# Patient Record
Sex: Male | Born: 1985 | Race: White | Hispanic: No | Marital: Single | Smoking: Never smoker
Health system: Southern US, Community
[De-identification: ages and names within clinical notes are randomized; demographics above are authoritative.]

## PROBLEM LIST (undated history)

## (undated) DIAGNOSIS — I10 Essential (primary) hypertension: Secondary | ICD-10-CM

## (undated) DIAGNOSIS — F32A Depression, unspecified: Secondary | ICD-10-CM

## (undated) DIAGNOSIS — F329 Major depressive disorder, single episode, unspecified: Secondary | ICD-10-CM

## (undated) DIAGNOSIS — K219 Gastro-esophageal reflux disease without esophagitis: Secondary | ICD-10-CM

## (undated) DIAGNOSIS — B019 Varicella without complication: Secondary | ICD-10-CM

## (undated) DIAGNOSIS — IMO0002 Reserved for concepts with insufficient information to code with codable children: Secondary | ICD-10-CM

## (undated) HISTORY — DX: Reserved for concepts with insufficient information to code with codable children: IMO0002

## (undated) HISTORY — DX: Varicella without complication: B01.9

## (undated) HISTORY — DX: Major depressive disorder, single episode, unspecified: F32.9

## (undated) HISTORY — DX: Essential (primary) hypertension: I10

## (undated) HISTORY — DX: Gastro-esophageal reflux disease without esophagitis: K21.9

## (undated) HISTORY — DX: Depression, unspecified: F32.A

---

## 2010-02-16 ENCOUNTER — Ambulatory Visit: Payer: Self-pay | Admitting: Sports Medicine

## 2012-08-13 ENCOUNTER — Ambulatory Visit: Payer: Self-pay | Admitting: Unknown Physician Specialty

## 2012-09-10 ENCOUNTER — Ambulatory Visit: Payer: Self-pay | Admitting: Unknown Physician Specialty

## 2012-11-21 ENCOUNTER — Ambulatory Visit (INDEPENDENT_AMBULATORY_CARE_PROVIDER_SITE_OTHER): Payer: BC Managed Care – PPO | Admitting: Internal Medicine

## 2012-11-21 ENCOUNTER — Encounter: Payer: Self-pay | Admitting: Internal Medicine

## 2012-11-21 VITALS — BP 162/78 | HR 102 | Temp 98.1°F | Resp 14 | Ht 73.0 in | Wt 180.8 lb

## 2012-11-21 DIAGNOSIS — F489 Nonpsychotic mental disorder, unspecified: Secondary | ICD-10-CM

## 2012-11-21 DIAGNOSIS — L708 Other acne: Secondary | ICD-10-CM

## 2012-11-21 DIAGNOSIS — F900 Attention-deficit hyperactivity disorder, predominantly inattentive type: Secondary | ICD-10-CM | POA: Insufficient documentation

## 2012-11-21 DIAGNOSIS — F5105 Insomnia due to other mental disorder: Secondary | ICD-10-CM

## 2012-11-21 DIAGNOSIS — F988 Other specified behavioral and emotional disorders with onset usually occurring in childhood and adolescence: Secondary | ICD-10-CM

## 2012-11-21 DIAGNOSIS — F411 Generalized anxiety disorder: Secondary | ICD-10-CM

## 2012-11-21 DIAGNOSIS — L709 Acne, unspecified: Secondary | ICD-10-CM | POA: Insufficient documentation

## 2012-11-21 DIAGNOSIS — I10 Essential (primary) hypertension: Secondary | ICD-10-CM

## 2012-11-21 MED ORDER — AMPHETAMINE-DEXTROAMPHETAMINE 20 MG PO TABS: 10.0000 mg | ORAL_TABLET | Freq: Two times a day (BID) | ORAL | Status: DC

## 2012-11-21 MED ORDER — AMLODIPINE BESYLATE 5 MG PO TABS: 5.0000 mg | ORAL_TABLET | Freq: Every day | ORAL | Status: DC

## 2012-11-21 MED ORDER — SULFAMETHOXAZOLE-TRIMETHOPRIM 800-160 MG PO TABS: 1.0000 | ORAL_TABLET | Freq: Two times a day (BID) | ORAL | Status: DC

## 2012-11-21 NOTE — Assessment & Plan Note (Signed)
Managed with ritalin. Refills given  

## 2012-11-21 NOTE — Assessment & Plan Note (Signed)
Suggested nonpharmacologic methods of management.

## 2012-11-21 NOTE — Progress Notes (Signed)
Patient ID: Jonathan Kerr, male   DOB: 06-29-1985, 27 y.o.   MRN: [id number]  Patient Active Problem List   Diagnosis Date Noted  . ADD (attention deficit hyperactivity disorder, inattentive type) 11/21/2012  . Essential hypertension, benign 11/21/2012  . Insomnia secondary to anxiety 11/21/2012  . Acne, mild 11/21/2012    Subjective:  CC:   Chief Complaint  Patient presents with  . Establish Care    HPI:   Jonathan Kerr is a 27 y.o. male who presents as a new patient to establish primary care with the chief complaint of Relocated from [place]   To be closer to Dad.  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  BS biochem.,  Masters Acupuncturist, studied in [place] .  Feeling stressed out by entreprenual endeavors and his Dad's deterioration .  gilrfried moved down here recently which has helped.  Not sleeping well,  5 to 6 hours daily ,trouble falling sleep.    Reads aregular book at night,  Only caffeine is in the morning,. (new since move to [facility])   Has not tried caffeine free.  Plays golf and walks the course.  Not exercising as much as previously   Takes adderall since elementary school. Takes it for ADD,  In the early morning.  Dr Blocker changed his dose to 10 mgs bid. Takes secodn dose, if needed at lunchtime.   GERD:  Managed with omeprazole 40 mg daily since high school .  Has symptoms within 48 hours if he lapses.  Prior EGD and colonoscopy by Mechele Collin 2 months ago for persistent gastritis.  Improved with adding daily lactobacillus, and sublingual antispasmodic prn .  Has felt better on a gluten free diet.  Gave up cheese reluctantly but feels 100% better on dairy free diet. Has tried lactase but has not helped.   Drinks almond mild or coconut milk.  Prior cholesterol screens have been very good.    Recurrent scalp bumps.   Prior history of psoriasis.  Attributes  To psoriarisi sbut exam more consistent with  Staph   Hypertension.  his whole life ,  Never treated. Resting heart rate is usually in the 60s.,  But not today.   Environmental allergies.  Takes claritin,  Tested and allergic to everything .  No history of asthma..  History of annual or biannual sinus infections but not in the last year.  Relocated from [place]   To be closer to Dad.  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  BS biochem.,  Masters Acupuncturist, studied in [place] .  Feeling stressed out by entreprenual endeavors and his Dad's deterioration .  gilrfried moved down here recently which has helped.  Not sleeping well,  5 to 6 hours daily ,trouble falling sleep.    Reads aregular book at night,  Only caffeine is in the morning,. (new since move to [facility])   Has not tried caffeine free.  Plays golf and walks the course.  Not exercising as much as previously   Takes adderall since elementary school. Takes it for ADD,  In the early morning.  Dr Blocker changed his dose to 10 mgs bid. Takes secodn dose, if needed at lunchtime.   GERD:  Managed with omeprazole 40 mg daily since high school .  Has symptoms within 48 hours if he lapses.  Prior EGD and colonoscopy by Mechele Collin  2 months ago for persistent gastritis.  Improved with adding daily lactobacillus, and sublingual antispasmodic prn .  Has felt better on a gluten free diet.  Gave up cheese reluctantly but feels 100% better on dairy free diet. Has tried lactase but has not helped.   Drinks almond mild or coconut milk.  Prior cholesterol screens have been very good.    Recurrent scalp bumps.   Prior history of psoriasis.  Attributes  To psoriarisi sbut exam more consistent with Staph   Hypertension.  his whole life ,  Never treated. Resting heart rate is usually in the 60s.,  But not today.   Environmental allergies.  Takes claritin,  Tested and allergic to everything .  No history of asthma..  History of annual or biannual  sinus infections but not in the last year.    Past Medical History  Diagnosis Date  . Depression   . Chicken pox   . GERD (gastroesophageal reflux disease)   . Ulcer   . Hypertension     History reviewed. No pertinent past surgical history.  Family History  Problem Relation Age of Onset  . Arthritis Mother   . Heart disease Mother   . Cancer Father   . Cancer Paternal Uncle     History   Social History  . Marital Status: Single    Spouse Name: N/A    Number of Children: N/A  . Years of Education: N/A   Occupational History  . Not on file.   Social History Main Topics  . Smoking status: Never Smoker   . Smokeless tobacco: Current User    Types: Snuff  . Alcohol Use: Yes  . Drug Use: No  . Sexually Active: Yes   Other Topics Concern  . Not on file   Social History Narrative  . No narrative on file       @ALLHX @    Review of Systems:   The remainder of the review of systems was negative except those addressed in the HPI.       Objective:  BP 162/78  Pulse 102  Temp(Src) 98.1 F (36.7 C) (Oral)  Resp 14  Ht 6\' 1"  (1.854 m)  Wt 180 lb 12 oz (81.988 kg)  BMI 23.85 kg/m2  SpO2 99%  General appearance: alert, cooperative and appears stated age Ears: normal TM's and external ear canals both ears Throat: lips, mucosa, and tongue normal; teeth and gums normal Neck: no adenopathy, no carotid bruit, supple, symmetrical, trachea midline and thyroid not enlarged, symmetric, no tenderness/mass/nodules Back: symmetric, no curvature. ROM normal. No CVA tenderness. Lungs: clear to auscultation bilaterally Heart: regular rate and rhythm, S1, S2 normal, no murmur, click, rub or gallop Abdomen: soft, non-tender; bowel sounds normal; no masses,  no organomegaly Pulses: 2+ and symmetric Skin: Skin color, texture, turgor normal. Erythematous pustular lesions on scalp No rashes or lesions Lymph nodes: Cervical, supraclavicular, and axillary nodes  normal.  Assessment and Plan:  ADD (attention deficit hyperactivity disorder, inattentive type) Managed with ritalin .  Refills given   Essential hypertension, benign Apparently present for years but not treated due to age  Trial of amlodipine  Insomnia secondary to anxiety Suggested nonpharmacologic methods of management.   Acne, mild With two pustular boils noted on Scalp today and facial acne noted.  7 days of empiric anti MRSA coverage with Septra.    Updated Medication List Outpatient Encounter Prescriptions as of 11/21/2012  Medication Sig Dispense Refill  . amphetamine-dextroamphetamine (ADDERALL) 20 MG  tablet Take 0.5 tablets (10 mg total) by mouth 2 (two) times daily.  60 tablet  0  . loratadine (CLARITIN) 10 MG tablet Take 10 mg by mouth daily.      Marland Kitchen omeprazole (PRILOSEC) 40 MG capsule Take 40 mg by mouth daily.      . [DISCONTINUED] amphetamine-dextroamphetamine (ADDERALL) 20 MG tablet Take 10 mg by mouth 2 (two) times daily.      . [DISCONTINUED] amphetamine-dextroamphetamine (ADDERALL) 20 MG tablet Take 0.5 tablets (10 mg total) by mouth 2 (two) times daily.  60 tablet  0  . amLODipine (NORVASC) 5 MG tablet Take 1 tablet (5 mg total) by mouth daily.  30 tablet  3  . sulfamethoxazole-trimethoprim (SEPTRA DS) 800-160 MG per tablet Take 1 tablet by mouth 2 (two) times daily.  14 tablet  0  . [DISCONTINUED] cetirizine (ZYRTEC) 10 MG tablet Take 10 mg by mouth daily.       No facility-administered encounter medications on file as of 11/21/2012.     Orders Placed This Encounter  Procedures  . Comprehensive metabolic panel  . Microalbumin / creatinine urine ratio    Return in about 4 weeks (around 12/19/2012).

## 2012-11-21 NOTE — Assessment & Plan Note (Signed)
With two pustular boils noted on Scalp today and facial acne noted.  7 days of empiric anti MRSA coverage with Septra.

## 2012-11-21 NOTE — Assessment & Plan Note (Signed)
Apparently present for years but not treated due to age  Trial of amlodipine

## 2012-11-21 NOTE — Patient Instructions (Addendum)
For your insomnia:   Try giving up caffeine  No computer work within 2 hours of bedtime  Add regular daily exercise  If no improvement,  Will initiate medication at 1 month follow up  For the scalp nodules:  Treatment for staph infection with 1 week of Septra DS  For the hypertension:  Trial of amlodipine 5 mg (1 tablet daily)  Return in 1 month

## 2012-12-04 ENCOUNTER — Encounter: Payer: Self-pay | Admitting: Unknown Physician Specialty

## 2013-01-28 ENCOUNTER — Ambulatory Visit (INDEPENDENT_AMBULATORY_CARE_PROVIDER_SITE_OTHER): Payer: BC Managed Care – PPO | Admitting: Adult Health

## 2013-01-28 ENCOUNTER — Encounter: Payer: Self-pay | Admitting: Adult Health

## 2013-01-28 VITALS — BP 126/74 | HR 68 | Temp 98.0°F | Resp 12 | Wt 181.0 lb

## 2013-01-28 DIAGNOSIS — H6120 Impacted cerumen, unspecified ear: Secondary | ICD-10-CM | POA: Insufficient documentation

## 2013-01-28 DIAGNOSIS — R21 Rash and other nonspecific skin eruption: Secondary | ICD-10-CM

## 2013-01-28 DIAGNOSIS — F900 Attention-deficit hyperactivity disorder, predominantly inattentive type: Secondary | ICD-10-CM

## 2013-01-28 DIAGNOSIS — H6121 Impacted cerumen, right ear: Secondary | ICD-10-CM

## 2013-01-28 DIAGNOSIS — Z23 Encounter for immunization: Secondary | ICD-10-CM

## 2013-01-28 DIAGNOSIS — F988 Other specified behavioral and emotional disorders with onset usually occurring in childhood and adolescence: Secondary | ICD-10-CM

## 2013-01-28 DIAGNOSIS — I10 Essential (primary) hypertension: Secondary | ICD-10-CM

## 2013-01-28 MED ORDER — AMPHETAMINE-DEXTROAMPHETAMINE 10 MG PO TABS: 10.0000 mg | ORAL_TABLET | Freq: Every day | ORAL | Status: DC

## 2013-01-28 MED ORDER — HYDROCHLOROTHIAZIDE 25 MG PO TABS: 25.0000 mg | ORAL_TABLET | Freq: Every day | ORAL | Status: DC

## 2013-01-28 NOTE — Assessment & Plan Note (Signed)
And having side effects with amlodipine of decreased libido. He has not taken this medication in approximately 21 days. Start HCTZ 25 mg one half tablet daily. Instructed to monitor blood pressure for the next one to 2 weeks. If blood pressure still above 120/70 then take one whole tablet daily.

## 2013-01-28 NOTE — Assessment & Plan Note (Signed)
Itching mainly at night. The left ankle is worse than the right. ?fungal. Appt with dermatology tomorrow.

## 2013-01-28 NOTE — Patient Instructions (Addendum)
  Please start the HCTZ 25 mg take 1/2 tablet every morning.  Please monitor your b/p for the first week or two while taking 1/2 tablet. If your b/p remains above 120/70 then take a full tablet.

## 2013-01-28 NOTE — Progress Notes (Signed)
  Subjective:    Patient ID: Jonathan Kerr, male    DOB: 1985/07/27, 27 y.o.   MRN: [id number]  HPI  Pt is pleasant 27 yo male, presents with concerns of decreased libido since beginning Norvasc 2 month ago. Pt states he stopped taking it 3 weeks ago and libido returned to normal.  Pt would like to try a different antihypertensive medication.  Pt also reports rash to back of ankles, has appt with dermatology tomorrow. He is also having trouble hearing out of his right ear and would like this checked. 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 Patient is also requesting refill on Adderall. He takes 10 mg tablets 1-2 tablets daily as needed.  Current Outpatient Prescriptions on File Prior to Visit  Medication Sig Dispense Refill  . loratadine (CLARITIN) 10 MG tablet Take 10 mg by mouth daily.      Marland Kitchen omeprazole (PRILOSEC) 40 MG capsule Take 40 mg by mouth daily.       No current facility-administered medications on file prior to visit.     Review of Systems  Constitutional: Negative.   HENT: Positive for hearing loss.   Respiratory: Negative.   Cardiovascular: Negative.   Genitourinary:       Decreased libido with amlodipine  Skin: Positive for rash.       Rash to bilateral ankles. Itching worse at night.  Psychiatric/Behavioral: Negative.   All other systems reviewed and are negative.       Objective:   Physical Exam  Constitutional: He is oriented to person, place, and time. He appears well-developed and well-nourished. No distress.  HENT:  Head: Normocephalic and atraumatic.  Right ear with cerumen impaction  Cardiovascular: Normal rate, regular rhythm, normal heart sounds and intact distal pulses.  Exam reveals no gallop and no friction rub.   No murmur heard. Pulmonary/Chest: Effort normal. No respiratory distress. He has no wheezes. He has no rales.  Lymphadenopathy:    He has no cervical adenopathy.  Neurological: He is alert and  oriented to person, place, and time.  Skin: Skin is warm, dry and intact. Rash noted.  Rash to bilateral ankles  Psychiatric: He has a normal mood and affect. His behavior is normal. Judgment and thought content normal.    BP 126/74  Pulse 68  Temp(Src) 98 F (36.7 C) (Oral)  Resp 12  Wt 181 lb (82.101 kg)  BMI 23.89 kg/m2  SpO2 96%       Assessment & Plan:

## 2013-01-28 NOTE — Addendum Note (Signed)
Addended by: Chandra Batch E on: 01/28/2013 10:32 AM   Modules accepted: Orders

## 2013-01-28 NOTE — Assessment & Plan Note (Signed)
Administered flu vaccine today.

## 2013-01-28 NOTE — Assessment & Plan Note (Signed)
Patient takes 10 mg tablets (2 tablets as needed) for ADD. Prescription was changed to 10 mg tablets. 2 prescriptions given.

## 2013-01-28 NOTE — Assessment & Plan Note (Signed)
Right ear impacted with cerumen. Tympanic membrane not visualized. Irrigate to remove the impaction.

## 2013-02-01 ENCOUNTER — Other Ambulatory Visit: Payer: Self-pay | Admitting: *Deleted

## 2013-02-01 MED ORDER — OMEPRAZOLE 40 MG PO CPDR: 40.0000 mg | DELAYED_RELEASE_CAPSULE | Freq: Every day | ORAL | Status: DC

## 2013-05-24 ENCOUNTER — Ambulatory Visit (INDEPENDENT_AMBULATORY_CARE_PROVIDER_SITE_OTHER): Payer: BC Managed Care – PPO | Admitting: Internal Medicine

## 2013-05-24 ENCOUNTER — Encounter: Payer: Self-pay | Admitting: Internal Medicine

## 2013-05-24 VITALS — BP 122/84 | HR 82 | Temp 98.2°F | Resp 18 | Ht 73.0 in | Wt 191.0 lb

## 2013-05-24 DIAGNOSIS — F411 Generalized anxiety disorder: Secondary | ICD-10-CM

## 2013-05-24 MED ORDER — AMPHETAMINE-DEXTROAMPHETAMINE 10 MG PO TABS: 10.0000 mg | ORAL_TABLET | Freq: Two times a day (BID) | ORAL | Status: DC

## 2013-05-24 MED ORDER — ALPRAZOLAM 0.5 MG PO TABS: 0.5000 mg | ORAL_TABLET | Freq: Every day | ORAL | Status: DC | PRN

## 2013-05-24 NOTE — Progress Notes (Signed)
Pre-visit discussion using our clinic review tool. No additional management support is needed unless otherwise documented below in the visit note.  

## 2013-05-24 NOTE — Patient Instructions (Signed)
Trial of short acting alprazolam to use as needed for anxiety or insomnia   Do not use if you have had more than 2 beers same day or i tmay cause excessive sedation  i

## 2013-05-24 NOTE — Progress Notes (Signed)
Patient ID: Jonathan AnconaJohn Thomas Kerr, male   DOB: 05/19/1985, 28 y.o.   MRN: [id number]  Patient Active Problem List   Diagnosis Date Noted  . Anxiety as acute reaction to exceptional stress 05/26/2013  . Cerumen impaction 01/28/2013  . Influenza vaccine needed 01/28/2013  . Rash and nonspecific skin eruption 01/28/2013  . ADD (attention deficit hyperactivity disorder, inattentive type) 11/21/2012  . Essential hypertension, benign 11/21/2012  . Insomnia secondary to anxiety 11/21/2012  . Acne, mild 11/21/2012    Subjective:  CC:   Chief Complaint  Patient presents with  . Follow-up    HPI:   Jonathan AlbeeJohn Thomas Priestis a 28 y.o. male who presents  Follow up on multiple issues  Cc;  Increased anxiety .   Triggers for increased anxiety include his father's losing battle with metastatic lung cancer and his recent business venture an opening in [employer]. He has been having episodes upon description sound like panic attacks. He is also having increased irritability and having relationship problems with his girlfriend since they are only together 3 nights per week due to a long distance relationship. He would like to start therapy. He has a history of a trial of salt Zoloft at age 28 which he took for a year. He did not likely Zoloft made him feel because it made him apathetic. He is also worried about the effect on his libido. He averages 2 beers per day and more on some occasions.   Past Medical History  Diagnosis Date  . Depression   . Chicken pox   . GERD (gastroesophageal reflux disease)   . Ulcer   . Hypertension     No past surgical history on file.     The following portions of the patient's history were reviewed and updated as appropriate: Allergies, current medications, and problem list.    Review of Systems:   12 Pt  review of systems was negative except those addressed in the HPI,     History   Social History  . Marital Status: Single    Spouse Name: N/A    Number  of Children: N/A  . Years of Education: N/A   Occupational History  . Not on file.   Social History Main Topics  . Smoking status: Never Smoker   . Smokeless tobacco: Current User    Types: Snuff  . Alcohol Use: Yes  . Drug Use: No  . Sexual Activity: Yes   Other Topics Concern  . Not on file   Social History Narrative  . No narrative on file    Objective:  Filed Vitals:   05/24/13 1336  BP: 122/84  Pulse: 82  Temp: 98.2 F (36.8 C)  Resp: 18     General appearance: alert, cooperative and appears stated age Ears: normal TM's and external ear canals both ears Throat: lips, mucosa, and tongue normal; teeth and gums normal Neck: no adenopathy, no carotid bruit, supple, symmetrical, trachea midline and thyroid not enlarged, symmetric, no tenderness/mass/nodules Back: symmetric, no curvature. ROM normal. No CVA tenderness. Lungs: clear to auscultation bilaterally Heart: regular rate and rhythm, S1, S2 normal, no murmur, click, rub or gallop Abdomen: soft, non-tender; bowel sounds normal; no masses,  no organomegaly Pulses: 2+ and symmetric Skin: Skin color, texture, turgor normal. No rashes or lesions Lymph nodes: Cervical, supraclavicular, and axillary nodes normal.  Assessment and Plan:  Anxiety as acute reaction to exceptional stress History is are entirely environmental due to his father's failing health and his business venture.  Since he has had prior adverse affects from Zoloft we discussed use of alprazolam for when necessary use for panic attacks and insomnia.. We discussed the addictive nature of this medication in fact it cannot be combined with excessive alcohol. If he finds using and a daily basis he will return and we will discuss SSRI therapy.   Updated Medication List Outpatient Encounter Prescriptions as of 05/24/2013  Medication Sig  . amphetamine-dextroamphetamine (ADDERALL) 10 MG tablet Take 1 tablet (10 mg total) by mouth 2 (two) times daily with a  meal.  . loratadine (CLARITIN) 10 MG tablet Take 10 mg by mouth daily.  Marland Kitchen omeprazole (PRILOSEC) 40 MG capsule Take 1 capsule (40 mg total) by mouth daily.  . [DISCONTINUED] amphetamine-dextroamphetamine (ADDERALL) 10 MG tablet Take 1 tablet (10 mg total) by mouth daily.  . [DISCONTINUED] amphetamine-dextroamphetamine (ADDERALL) 10 MG tablet Take 1 tablet (10 mg total) by mouth 2 (two) times daily with a meal.  . [DISCONTINUED] amphetamine-dextroamphetamine (ADDERALL) 10 MG tablet Take 1 tablet (10 mg total) by mouth 2 (two) times daily with a meal.  . ALPRAZolam (XANAX) 0.5 MG tablet Take 1 tablet (0.5 mg total) by mouth daily as needed for anxiety.  . [DISCONTINUED] hydrochlorothiazide (HYDRODIURIL) 25 MG tablet Take 1 tablet (25 mg total) by mouth daily.     No orders of the defined types were placed in this encounter.    No Follow-up on file.

## 2013-05-26 DIAGNOSIS — F339 Major depressive disorder, recurrent, unspecified: Secondary | ICD-10-CM | POA: Insufficient documentation

## 2013-05-26 NOTE — Assessment & Plan Note (Signed)
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 Since he has had prior adverse affects from Zoloft we discussed use of alprazolam for when necessary use for panic attacks and insomnia.. We discussed the addictive nature of this medication in fact it cannot be combined with excessive alcohol. If he finds using and a daily basis he will return and we will discuss SSRI therapy.

## 2013-05-29 ENCOUNTER — Telehealth: Payer: Self-pay | Admitting: Internal Medicine

## 2013-05-29 NOTE — Telephone Encounter (Signed)
Relevant patient education mailed to patient.  

## 2013-08-21 ENCOUNTER — Encounter: Payer: Self-pay | Admitting: Internal Medicine

## 2013-08-21 ENCOUNTER — Telehealth: Payer: Self-pay | Admitting: Internal Medicine

## 2013-08-21 ENCOUNTER — Ambulatory Visit (INDEPENDENT_AMBULATORY_CARE_PROVIDER_SITE_OTHER): Payer: BC Managed Care – PPO | Admitting: Internal Medicine

## 2013-08-21 VITALS — BP 110/78 | HR 96 | Temp 98.6°F | Resp 16 | Wt 183.0 lb

## 2013-08-21 DIAGNOSIS — B9789 Other viral agents as the cause of diseases classified elsewhere: Secondary | ICD-10-CM | POA: Diagnosis not present

## 2013-08-21 DIAGNOSIS — B349 Viral infection, unspecified: Secondary | ICD-10-CM

## 2013-08-21 DIAGNOSIS — R509 Fever, unspecified: Secondary | ICD-10-CM | POA: Diagnosis not present

## 2013-08-21 LAB — POC INFLUENZA A&B (BINAX/QUICKVUE)
INFLUENZA A, POC: NEGATIVE
INFLUENZA B, POC: NEGATIVE

## 2013-08-21 MED ORDER — PROMETHAZINE HCL 25 MG RE SUPP: 25.0000 mg | Freq: Four times a day (QID) | RECTAL | Status: DC | PRN

## 2013-08-21 NOTE — Progress Notes (Signed)
Pre-visit discussion using our clinic review tool. No additional management support is needed unless otherwise documented below in the visit note.  

## 2013-08-21 NOTE — Telephone Encounter (Signed)
Please advise 

## 2013-08-21 NOTE — Telephone Encounter (Signed)
Patient Information:  Caller Name: Jonita AlbeeJohn Thomas  Phone: [phone]  Patient: Marca Anconariest, Hoyle Thomas  Gender: Male  DOB: 10/02/1985  Age: 28 Years  PCP: Duncan Dullullo, Teresa (Adults only)  Office Follow Up:  Does the office need to follow up with this patient?: Yes  Instructions For The Office: Requesting Rx  RN Note:  Mom calling regarding Child/Bernard who c/o body aches, fever, chills, headache.  [phone].  Desires office visit today if this cannot be done.  Symptoms  Reason For Call & Symptoms: c/o headache sore throat, chills, fever, body aches  Reviewed Health History In EMR: Yes  Reviewed Medications In EMR: Yes  Reviewed Allergies In EMR: Yes  Reviewed Surgeries / Procedures: Yes  Date of Onset of Symptoms: 08/20/2013  Any Fever: Yes  Fever Taken: Oral  Fever Time Of Reading: 11:07:32  Fever Last Reading: 99.7  Guideline(s) Used:  Influenza - Seasonal  Disposition Per Guideline:   Discuss with PCP and Callback by Nurse Today  Reason For Disposition Reached:   Patient requests antiviral medicine for influenza and flu symptoms present < 48 hours  Advice Given:  N/A  Patient Will Follow Care Advice:  YES

## 2013-08-21 NOTE — Telephone Encounter (Signed)
Patient scheduled and notified.

## 2013-08-21 NOTE — Telephone Encounter (Signed)
Can you give him an early afternoon appt?  Get a flu and strp test ok  to overbook

## 2013-08-21 NOTE — Patient Instructions (Signed)
Your symptoms are consistent with a viral infection  Possibly Norovirus.  Your flu test was negative  If yo develop nausea with vomiting,  Start using the phenergan suppositories and simply your diet to ginger ale for hydration.  Add saltines when the vomiting has stopped   You can use ibuprofen and tylenol for achiness   Norovirus Infection Norovirus illness is caused by a viral infection. The term norovirus refers to a group of viruses. Any of those viruses can cause norovirus illness. This illness is often referred to by other names such as viral gastroenteritis, stomach flu, and food poisoning. Anyone can get a norovirus infection. People can have the illness multiple times during their lifetime. CAUSES  Norovirus is found in the stool or vomit of infected people. It is easily spread from person to person (contagious). People with norovirus are contagious from the moment they begin feeling ill. They may remain contagious for as long as 3 days to 2 weeks after recovery. People can become infected with the virus in several ways. This includes:  Eating food or drinking liquids that are contaminated with norovirus.  Touching surfaces or objects contaminated with norovirus, and then placing your hand in your mouth.  Having direct contact with a person who is infected and shows symptoms. This may occur while caring for someone with illness or while sharing foods or eating utensils with someone who is ill. SYMPTOMS  Symptoms usually begin 1 to 2 days after ingestion of the virus. Symptoms may include:  Nausea.  Vomiting.  Diarrhea.  Stomach cramps.  Low-grade fever.  Chills.  Headache.  Muscle aches.  Tiredness. Most people with norovirus illness get better within 1 to 2 days. Some people become dehydrated because they cannot drink enough liquids to replace those lost from vomiting and diarrhea. This is especially true for young children, the elderly, and others who are unable to  care for themselves. DIAGNOSIS  Diagnosis is based on your symptoms and exam. Currently, only state public health laboratories have the ability to test for norovirus in stool or vomit. TREATMENT  No specific treatment exists for norovirus infections. No vaccine is available to prevent infections. Norovirus illness is usually brief in healthy people. If you are ill with vomiting and diarrhea, you should drink enough water and fluids to keep your urine clear or pale yellow. Dehydration is the most serious health effect that can result from this infection. By drinking oral rehydration solution (ORS), people can reduce their chance of becoming dehydrated. There are many commercially available pre-made and powdered ORS designed to safely rehydrate people. These may be recommended by your caregiver. Replace any new fluid losses from diarrhea or vomiting with ORS as follows:  If your child weighs 10 kg or less (22 lb or less), give 60 to 120 ml ( to  cup or 2 to 4 oz) of ORS for each diarrheal stool or vomiting episode.  If your child weighs more than 10 kg (more than 22 lb), give 120 to 240 ml ( to 1 cup or 4 to 8 oz) of ORS for each diarrheal stool or vomiting episode. HOME CARE INSTRUCTIONS   Follow all your caregiver's instructions.  Avoid sugar-free and alcoholic drinks while ill.  Only take over-the-counter or prescription medicines for pain, vomiting, diarrhea, or fever as directed by your caregiver. You can decrease your chances of coming in contact with norovirus or spreading it by following these steps:  Frequently wash your hands, especially after using the toilet,  changing diapers, and before eating or preparing food.  Carefully wash fruits and vegetables. Cook shellfish before eating them.  Do not prepare food for others while you are infected and for at least 3 days after recovering from illness.  Thoroughly clean and disinfect contaminated surfaces immediately after an episode of  illness using a bleach-based household cleaner.  Immediately remove and wash clothing or linens that may be contaminated with the virus.  Use the toilet to dispose of any vomit or stool. Make sure the surrounding area is kept clean.  Food that may have been contaminated by an ill person should be discarded. SEEK IMMEDIATE MEDICAL CARE IF:   You develop symptoms of dehydration that do not improve with fluid replacement. This may include:  Excessive sleepiness.  Lack of tears.  Dry mouth.  Dizziness when standing.  Weak pulse. Document Released: 07/02/2002 Document Revised: 07/04/2011 Document Reviewed: 08/03/2009 [facility] Patient Information 2014 [employer] [place].

## 2013-08-21 NOTE — Progress Notes (Signed)
Patient ID: Airon Thomas Poage, male   DOB: [birth year]Marca Ancona[birth year], 28 y.o.   MRN: [id number]   Patient Active Problem List   Diagnosis Date Noted  . Acute viral syndrome 08/23/2013  . Anxiety as acute reaction to exceptional stress 05/26/2013  . Cerumen impaction 01/28/2013  . Influenza vaccine needed 01/28/2013  . Rash and nonspecific skin eruption 01/28/2013  . ADD (attention deficit hyperactivity disorder, inattentive type) 11/21/2012  . Essential hypertension, benign 11/21/2012  . Insomnia secondary to anxiety 11/21/2012  . Acne, mild 11/21/2012    Subjective:  CC:   Chief Complaint  Patient presents with  . Acute Visit    Chills, body aches , febrile 100.0    HPI:   Marca AnconaJohn Thomas Demauro is a 28 y.o. male who presents for  evaluatio of recent onset of Sore throat, followed by headache,  Myalgias and nausea.  Positive sick contacts at work,  No fevers., TMax 100.0.  Symptoms started 48 hours ago.    Past Medical History  Diagnosis Date  . Depression   . Chicken pox   . GERD (gastroesophageal reflux disease)   . Ulcer   . Hypertension     History reviewed. No pertinent past surgical history.     The following portions of the patient's history were reviewed and updated as appropriate: Allergies, current medications, and problem list.    Review of Systems:   Patient denies headache, fevers, malaise, unintentional weight loss, skin rash, eye pain, sinus congestion and sinus pain, sore throat, dysphagia,  hemoptysis , cough, dyspnea, wheezing, chest pain, palpitations, orthopnea, edema, abdominal pain, nausea, melena, diarrhea, constipation, flank pain, dysuria, hematuria, urinary  Frequency, nocturia, numbness, tingling, seizures,  Focal weakness, Loss of consciousness,  Tremor, insomnia, depression, anxiety, and suicidal ideation.     History   Social History  . Marital Status: Single    Spouse Name: N/A    Number of Children: N/A  . Years of Education: N/A    Occupational History  . Not on file.   Social History Main Topics  . Smoking status: Never Smoker   . Smokeless tobacco: Current User    Types: Snuff  . Alcohol Use: Yes  . Drug Use: No  . Sexual Activity: Yes   Other Topics Concern  . Not on file   Social History Narrative  . No narrative on file    Objective:  Filed Vitals:   08/21/13 1441  BP: 110/78  Pulse: 96  Temp: 98.6 F (37 C)  Resp: 16     General appearance: alert, cooperative and appears stated age Ears: normal TM's and external ear canals both ears Throat: lips, mucosa, and tongue normal; teeth and gums normal Neck: no adenopathy, no carotid bruit, supple, symmetrical, trachea midline and thyroid not enlarged, symmetric, no tenderness/mass/nodules Lungs: clear to auscultation bilaterally Heart: regular rate and rhythm, S1, S2 normal, no murmur, click, rub or gallop Skin: Skin color, texture, turgor normal. No rashes or lesions Lymph nodes: Cervical, supraclavicular, and axillary nodes normal.  Assessment and Plan:  Acute viral syndrome Rapid flu test was negative,  No recent contacts with flue, but norovirus a possibility .  Pr phenergan prn    Updated Medication List Outpatient Encounter Prescriptions as of 08/21/2013  Medication Sig  . ALPRAZolam (XANAX) 0.5 MG tablet Take 1 tablet (0.5 mg total) by mouth daily as needed for anxiety.  Marland Kitchen. amphetamine-dextroamphetamine (ADDERALL) 10 MG tablet Take 1 tablet (10 mg total) by mouth 2 (two) times daily with a  meal.  . loratadine (CLARITIN) 10 MG tablet Take 10 mg by mouth daily.  Marland Kitchen. omeprazole (PRILOSEC) 40 MG capsule Take 1 capsule (40 mg total) by mouth daily.  . promethazine (PHENERGAN) 25 MG suppository Place 1 suppository (25 mg total) rectally every 6 (six) hours as needed for nausea or vomiting.     Orders Placed This Encounter  Procedures  . POC Influenza A&B (Binax test)    No Follow-up on file.

## 2013-08-23 ENCOUNTER — Encounter: Payer: Self-pay | Admitting: Internal Medicine

## 2013-08-23 DIAGNOSIS — B349 Viral infection, unspecified: Secondary | ICD-10-CM | POA: Insufficient documentation

## 2013-08-23 NOTE — Assessment & Plan Note (Signed)
Rapid flu test was negative,  No recent contacts with flue, but norovirus a possibility .  Pr phenergan prn

## 2013-11-15 ENCOUNTER — Ambulatory Visit (INDEPENDENT_AMBULATORY_CARE_PROVIDER_SITE_OTHER): Payer: BC Managed Care – PPO | Admitting: Adult Health

## 2013-11-15 ENCOUNTER — Encounter: Payer: Self-pay | Admitting: Adult Health

## 2013-11-15 VITALS — BP 129/83 | HR 67 | Temp 97.5°F | Resp 14 | Ht 73.0 in | Wt 181.5 lb

## 2013-11-15 DIAGNOSIS — M25512 Pain in left shoulder: Secondary | ICD-10-CM

## 2013-11-15 DIAGNOSIS — F900 Attention-deficit hyperactivity disorder, predominantly inattentive type: Secondary | ICD-10-CM

## 2013-11-15 DIAGNOSIS — M25519 Pain in unspecified shoulder: Secondary | ICD-10-CM

## 2013-11-15 DIAGNOSIS — F909 Attention-deficit hyperactivity disorder, unspecified type: Secondary | ICD-10-CM

## 2013-11-15 MED ORDER — AMPHETAMINE-DEXTROAMPHETAMINE 10 MG PO TABS: 10.0000 mg | ORAL_TABLET | Freq: Two times a day (BID) | ORAL | Status: DC

## 2013-11-15 MED ORDER — ALPRAZOLAM 0.5 MG PO TABS: 0.5000 mg | ORAL_TABLET | Freq: Every day | ORAL | Status: DC | PRN
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
## 2013-11-15 NOTE — Progress Notes (Signed)
Pre visit review using our clinic review tool, if applicable. No additional management support is needed unless otherwise documented below in the visit note. 

## 2013-11-26 ENCOUNTER — Other Ambulatory Visit (INDEPENDENT_AMBULATORY_CARE_PROVIDER_SITE_OTHER): Payer: BC Managed Care – PPO

## 2013-11-26 ENCOUNTER — Ambulatory Visit (INDEPENDENT_AMBULATORY_CARE_PROVIDER_SITE_OTHER): Payer: BC Managed Care – PPO | Admitting: Family Medicine

## 2013-11-26 ENCOUNTER — Encounter: Payer: Self-pay | Admitting: Family Medicine

## 2013-11-26 VITALS — BP 126/82 | HR 68 | Ht 73.0 in | Wt 178.0 lb

## 2013-11-26 DIAGNOSIS — M25512 Pain in left shoulder: Secondary | ICD-10-CM

## 2013-11-26 DIAGNOSIS — M7552 Bursitis of left shoulder: Secondary | ICD-10-CM

## 2013-11-26 DIAGNOSIS — IMO0002 Reserved for concepts with insufficient information to code with codable children: Secondary | ICD-10-CM

## 2013-11-26 DIAGNOSIS — M25519 Pain in unspecified shoulder: Secondary | ICD-10-CM

## 2013-11-26 DIAGNOSIS — M75100 Unspecified rotator cuff tear or rupture of unspecified shoulder, not specified as traumatic: Secondary | ICD-10-CM

## 2013-11-26 DIAGNOSIS — M7550 Bursitis of unspecified shoulder: Secondary | ICD-10-CM | POA: Insufficient documentation

## 2013-11-26 MED ORDER — MELOXICAM 15 MG PO TABS: 15.0000 mg | ORAL_TABLET | Freq: Every day | ORAL | Status: DC
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
## 2013-11-26 NOTE — Assessment & Plan Note (Signed)
Patient was given injection today. Differential also does include early psoriatic arthritis secondary to the calcium changed on the labrum but I think this is very unlikely. Patient given home exercise program, icing protocol, and 10 days of anti-inflammatories, and we'll likely respond very well to conservative therapy. Patient will come back in 3 weeks for further evaluation and treatment.

## 2013-11-26 NOTE — Patient Instructions (Addendum)
Shoulder bursitis.  Ice 20 minutes 2 times daily. Usually after activity and before bed. Exercises 3 times a week.  Meloxicam daily for 10 days then as needed. Come back in 3 weeks and we will see how you are doing.

## 2013-12-11 ENCOUNTER — Ambulatory Visit: Payer: BC Managed Care – PPO | Admitting: Internal Medicine

## 2013-12-17 ENCOUNTER — Ambulatory Visit: Payer: BC Managed Care – PPO | Admitting: Family Medicine

## 2014-02-27 ENCOUNTER — Ambulatory Visit (INDEPENDENT_AMBULATORY_CARE_PROVIDER_SITE_OTHER): Payer: BC Managed Care – PPO | Admitting: *Deleted

## 2014-02-27 ENCOUNTER — Ambulatory Visit: Payer: BC Managed Care – PPO

## 2014-02-27 DIAGNOSIS — Z23 Encounter for immunization: Secondary | ICD-10-CM

## 2014-03-10 ENCOUNTER — Ambulatory Visit (INDEPENDENT_AMBULATORY_CARE_PROVIDER_SITE_OTHER): Payer: BC Managed Care – PPO | Admitting: Internal Medicine

## 2014-03-10 ENCOUNTER — Encounter: Payer: Self-pay | Admitting: Internal Medicine

## 2014-03-10 ENCOUNTER — Encounter (INDEPENDENT_AMBULATORY_CARE_PROVIDER_SITE_OTHER): Payer: Self-pay

## 2014-03-10 VITALS — BP 140/86 | HR 73 | Temp 98.2°F | Resp 16 | Ht 73.0 in | Wt 183.8 lb

## 2014-03-10 DIAGNOSIS — F419 Anxiety disorder, unspecified: Secondary | ICD-10-CM

## 2014-03-10 DIAGNOSIS — F5105 Insomnia due to other mental disorder: Secondary | ICD-10-CM

## 2014-03-10 DIAGNOSIS — F900 Attention-deficit hyperactivity disorder, predominantly inattentive type: Secondary | ICD-10-CM

## 2014-03-10 MED ORDER — AMPHETAMINE-DEXTROAMPHETAMINE 10 MG PO TABS: 10.0000 mg | ORAL_TABLET | Freq: Two times a day (BID) | ORAL | Status: DC

## 2014-03-10 MED ORDER — FIRST-DUKES MOUTHWASH MT SUSP: OROMUCOSAL | Status: AC

## 2014-03-10 MED ORDER — ALPRAZOLAM 0.5 MG PO TABS: 0.5000 mg | ORAL_TABLET | Freq: Every day | ORAL | Status: DC | PRN

## 2014-03-10 NOTE — Progress Notes (Signed)
Patient ID: Jonathan AnconaJohn Thomas Kerr, male   DOB: 06/03/1985, 28 y.o.   MRN: [id number]   Patient Active Problem List   Diagnosis Date Noted  . Subacromial bursitis 11/26/2013  . Left shoulder pain 11/15/2013  . Acute viral syndrome 08/23/2013  . Anxiety as acute reaction to exceptional stress 05/26/2013  . Cerumen impaction 01/28/2013  . Influenza vaccine needed 01/28/2013  . Rash and nonspecific skin eruption 01/28/2013  . ADD (attention deficit hyperactivity disorder, inattentive type) 11/21/2012  . Essential hypertension, benign 11/21/2012  . Insomnia secondary to anxiety 11/21/2012  . Acne, mild 11/21/2012    Subjective:  CC:   Chief Complaint  Patient presents with  . Follow-up    medications    HPI:   Jonathan AnconaJohn Thomas Kerr is a 28 y.o. male who presents for  Follow up on ADD and anxiety managed with adderall and alprazolam  he is doing well at work  and finishing her work on time.  he is not having any adverse effects from the medication, including headache, insomnia, tachycardia and tremor.  S  Had psoriasis ,  Saw dermatology ,  Was treatd with [place] ,  Miserable for 2 weeks:  Nause,a  Felt like death  Stopped taking it    Past Medical History  Diagnosis Date  . Depression   . Chicken pox   . GERD (gastroesophageal reflux disease)   . Ulcer   . Hypertension     No past surgical history on file.     The following portions of the patient's history were reviewed and updated as appropriate: Allergies, current medications, and problem list.    Review of Systems:   Patient denies headache, fevers, malaise, unintentional weight loss, skin rash, eye pain, sinus congestion and sinus pain, sore throat, dysphagia,  hemoptysis , cough, dyspnea, wheezing, chest pain, palpitations, orthopnea, edema, abdominal pain, nausea, melena, diarrhea, constipation, flank pain, dysuria, hematuria, urinary  Frequency, nocturia, numbness, tingling, seizures,  Focal weakness, Loss of  consciousness,  Tremor, insomnia, depression, anxiety, and suicidal ideation.     History   Social History  . Marital Status: Single    Spouse Name: N/A    Number of Children: N/A  . Years of Education: N/A   Occupational History  . Not on file.   Social History Main Topics  . Smoking status: Never Smoker   . Smokeless tobacco: Former NeurosurgeonUser    Types: Snuff  . Alcohol Use: 0.0 oz/week    0 Not specified per week  . Drug Use: No  . Sexual Activity: Yes   Other Topics Concern  . Not on file   Social History Narrative    Objective:  Filed Vitals:   03/10/14 1831  BP: 140/86  Pulse: 73  Temp: 98.2 F (36.8 C)  Resp: 16     General appearance: alert, cooperative and appears stated age Ears: normal TM's and external ear canals both ears Throat: lips, mucosa, and tongue normal; teeth and gums normal Neck: no adenopathy, no carotid bruit, supple, symmetrical, trachea midline and thyroid not enlarged, symmetric, no tenderness/mass/nodules Back: symmetric, no curvature. ROM normal. No CVA tenderness. Lungs: clear to auscultation bilaterally Heart: regular rate and rhythm, S1, S2 normal, no murmur, click, rub or gallop Abdomen: soft, non-tender; bowel sounds normal; no masses,  no organomegaly Pulses: 2+ and symmetric Skin: Skin color, texture, turgor normal. No rashes or lesions Lymph nodes: Cervical, supraclavicular, and axillary nodes normal.  Assessment and Plan:  ADD (attention deficit hyperactivity disorder, inattentive  type) Patient has been given refills for adderallfor management of ADD for 3 consecutive months and will return in 3 to 6 months.  The risks and benefits of this medication have been reviewed with patient, including drug dependence,  increased risk of suicide , syncope, arrhythmia, sudden death, insomnia and, in her case , the  benefit of weight loss  Insomnia secondary to anxiety Suggested nonpharmacologic methods of management.   Using alprazolam  rarely. The risks and benefits of benzodiazepine use were discussed with patient today including excessive sedation leading to respiratory depression,  impaired thinking/driving, and addiction.  Patient was advised to avoid concurrent use with alcohol, to use medication only as needed and not to share with others  .      Updated Medication List Outpatient Encounter Prescriptions as of 03/10/2014  Medication Sig  . ALPRAZolam (XANAX) 0.5 MG tablet Take 1 tablet (0.5 mg total) by mouth daily as needed for anxiety.  Marland Kitchen. amphetamine-dextroamphetamine (ADDERALL) 10 MG tablet Take 1 tablet (10 mg total) by mouth 2 (two) times daily with a meal.  . loratadine (CLARITIN) 10 MG tablet Take 10 mg by mouth daily.  Marland Kitchen. omeprazole (PRILOSEC) 40 MG capsule Take 1 capsule (40 mg total) by mouth daily.  . [DISCONTINUED] ALPRAZolam (XANAX) 0.5 MG tablet Take 1 tablet (0.5 mg total) by mouth daily as needed for anxiety.  . [DISCONTINUED] amphetamine-dextroamphetamine (ADDERALL) 10 MG tablet Take 1 tablet (10 mg total) by mouth 2 (two) times daily with a meal.  . [DISCONTINUED] amphetamine-dextroamphetamine (ADDERALL) 10 MG tablet Take 1 tablet (10 mg total) by mouth 2 (two) times daily with a meal.  . [DISCONTINUED] amphetamine-dextroamphetamine (ADDERALL) 10 MG tablet Take 1 tablet (10 mg total) by mouth 2 (two) times daily with a meal.  . Apremilast (OTEZLA) 30 MG TABS Take 1 tablet by mouth 2 (two) times daily.  . Diphenhyd-Hydrocort-Nystatin (FIRST-DUKES MOUTHWASH) SUSP Swish and spit   Up to 4 times daily  . meloxicam (MOBIC) 15 MG tablet Take 1 tablet (15 mg total) by mouth daily.     No orders of the defined types were placed in this encounter.    No Follow-up on file.

## 2014-03-10 NOTE — Progress Notes (Signed)
Pre-visit discussion using our clinic review tool. No additional management support is needed unless otherwise documented below in the visit note.  

## 2014-03-12 NOTE — Assessment & Plan Note (Signed)
Patient has been given refills for adderallfor management of ADD for 3 consecutive months and will return in 3 to 6 months.  The risks and benefits of this medication have been reviewed with patient, including drug dependence,  increased risk of suicide , syncope, arrhythmia, sudden death, insomnia and, in her case , the  benefit of weight loss

## 2014-03-12 NOTE — Assessment & Plan Note (Signed)
Suggested nonpharmacologic methods of management.   Using alprazolam rarely. The risks and benefits of benzodiazepine use were discussed with patient today including excessive sedation leading to respiratory depression,  impaired thinking/driving, and addiction.  Patient was advised to avoid concurrent use with alcohol, to use medication only as needed and not to share with others  .

## 2014-04-03 ENCOUNTER — Telehealth: Payer: Self-pay | Admitting: Internal Medicine

## 2014-04-03 MED ORDER — HYOSCYAMINE SULFATE 0.125 MG SL SUBL: 0.1250 mg | SUBLINGUAL_TABLET | SUBLINGUAL | Status: DC | PRN

## 2014-04-03 NOTE — Telephone Encounter (Signed)
Patient  Notified and script faxed to pharmacy.

## 2014-04-03 NOTE — Telephone Encounter (Signed)
Yes, but there are several doses, i have printed out one

## 2014-04-03 NOTE — Telephone Encounter (Signed)
Patient cannot get into see his GI doctor until 2/16 and would like to know if you can give script for Hyoscyamine for abdominal cramping stated he has been prescribed in the past. Please advise [place] in [place].

## 2014-04-09 ENCOUNTER — Other Ambulatory Visit: Payer: Self-pay

## 2014-04-09 MED ORDER — OMEPRAZOLE 40 MG PO CPDR: 40.0000 mg | DELAYED_RELEASE_CAPSULE | Freq: Every day | ORAL | Status: DC

## 2014-05-06 ENCOUNTER — Ambulatory Visit: Payer: Self-pay | Admitting: Unknown Physician Specialty

## 2014-05-29 ENCOUNTER — Ambulatory Visit: Payer: Self-pay | Admitting: Unknown Physician Specialty

## 2014-06-22 HISTORY — PX: CHOLECYSTECTOMY: SHX55

## 2014-08-11 ENCOUNTER — Encounter: Payer: Self-pay | Admitting: Internal Medicine

## 2014-08-18 LAB — SURGICAL PATHOLOGY

## 2014-09-01 ENCOUNTER — Telehealth: Payer: Self-pay | Admitting: *Deleted

## 2014-09-01 NOTE — Telephone Encounter (Signed)
Pt called requesting Alprazolam and Adderall refills.  Last OV 11.16.15.  Pt advised he would need an appoint as it has been 6 months since last OV.  Pt asked that I confirm with you that he needed an appoint.  Please advise refill and appoint status.

## 2014-09-01 NOTE — Telephone Encounter (Signed)
Yes, he does.  Controlled substances require a 6 month OV follow up.

## 2014-09-01 NOTE — Telephone Encounter (Signed)
Pt scheduled appoint 5.11.16 at 9 am.

## 2014-09-03 ENCOUNTER — Encounter: Payer: Self-pay | Admitting: Internal Medicine

## 2014-09-03 ENCOUNTER — Ambulatory Visit (INDEPENDENT_AMBULATORY_CARE_PROVIDER_SITE_OTHER): Payer: BLUE CROSS/BLUE SHIELD | Admitting: Internal Medicine

## 2014-09-03 VITALS — BP 118/70 | HR 72 | Temp 97.7°F | Resp 16 | Ht 73.0 in | Wt 172.5 lb

## 2014-09-03 DIAGNOSIS — F900 Attention-deficit hyperactivity disorder, predominantly inattentive type: Secondary | ICD-10-CM

## 2014-09-03 DIAGNOSIS — I498 Other specified cardiac arrhythmias: Secondary | ICD-10-CM

## 2014-09-03 DIAGNOSIS — F329 Major depressive disorder, single episode, unspecified: Secondary | ICD-10-CM

## 2014-09-03 DIAGNOSIS — I10 Essential (primary) hypertension: Secondary | ICD-10-CM | POA: Diagnosis not present

## 2014-09-03 DIAGNOSIS — R002 Palpitations: Secondary | ICD-10-CM | POA: Insufficient documentation

## 2014-09-03 DIAGNOSIS — F419 Anxiety disorder, unspecified: Secondary | ICD-10-CM

## 2014-09-03 DIAGNOSIS — F5105 Insomnia due to other mental disorder: Secondary | ICD-10-CM

## 2014-09-03 DIAGNOSIS — F32A Depression, unspecified: Secondary | ICD-10-CM

## 2014-09-03 LAB — COMPREHENSIVE METABOLIC PANEL
ALT: 28 U/L (ref 0–53)
AST: 30 U/L (ref 0–37)
Albumin: 4.6 g/dL (ref 3.5–5.2)
Alkaline Phosphatase: 44 U/L (ref 39–117)
BUN: 12 mg/dL (ref 6–23)
CALCIUM: 10.4 mg/dL (ref 8.4–10.5)
CO2: 30 mEq/L (ref 19–32)
Chloride: 101 mEq/L (ref 96–112)
Creatinine, Ser: 0.95 mg/dL (ref 0.40–1.50)
GFR: 99.04 mL/min (ref >60.00)
GLUCOSE: 82 mg/dL (ref 70–99)
Potassium: 4.1 mEq/L (ref 3.5–5.1)
Sodium: 137 mEq/L (ref 135–145)
TOTAL PROTEIN: 7.9 g/dL (ref 6.0–8.3)
Total Bilirubin: 0.8 mg/dL (ref 0.2–1.2)

## 2014-09-03 LAB — CBC WITH DIFFERENTIAL/PLATELET
Basophils Absolute: 0 10*3/uL (ref 0.0–0.1)
Basophils Relative: 0.4 % (ref 0.0–3.0)
EOS PCT: 3 % (ref 0.0–5.0)
Eosinophils Absolute: 0.2 10*3/uL (ref 0.0–0.7)
HCT: 44.4 % (ref 39.0–52.0)
HEMOGLOBIN: 14.6 g/dL (ref 13.0–17.0)
Lymphocytes Relative: 22.6 % (ref 12.0–46.0)
Lymphs Abs: 1.5 10*3/uL (ref 0.7–4.0)
MCHC: 32.9 g/dL (ref 30.0–36.0)
MCV: 83.4 fl (ref 78.0–100.0)
MONOS PCT: 9.9 % (ref 3.0–12.0)
Monocytes Absolute: 0.6 10*3/uL (ref 0.1–1.0)
NEUTROS ABS: 4.2 10*3/uL (ref 1.4–7.7)
NEUTROS PCT: 64.1 % (ref 43.0–77.0)
Platelets: 167 10*3/uL (ref 150.0–400.0)
RBC: 5.32 Mil/uL (ref 4.22–5.81)
RDW: 14.7 % (ref 11.5–15.5)
WBC: 6.6 10*3/uL (ref 4.0–10.5)

## 2014-09-03 LAB — LIPID PANEL
Cholesterol: 209 mg/dL — ABNORMAL HIGH (ref 0–200)
HDL: 52.6 mg/dL (ref >39.00)
LDL Cholesterol: 133 mg/dL — ABNORMAL HIGH (ref 0–99)
NONHDL: 156.4
Total CHOL/HDL Ratio: 4
Triglycerides: 115 mg/dL (ref 0.0–149.0)
VLDL: 23 mg/dL (ref 0.0–40.0)

## 2014-09-03 LAB — MAGNESIUM: Magnesium: 2.2 mg/dL (ref 1.5–2.5)

## 2014-09-03 LAB — TROPONIN I: TNIDX: 0.01 ug/L (ref 0.00–0.06)

## 2014-09-03 LAB — TSH: TSH: 0.87 u[IU]/mL (ref 0.35–4.50)

## 2014-09-03 MED ORDER — METOPROLOL TARTRATE 25 MG PO TABS: 25.0000 mg | ORAL_TABLET | Freq: Two times a day (BID) | ORAL | Status: DC

## 2014-09-03 MED ORDER — AMPHETAMINE-DEXTROAMPHETAMINE 10 MG PO TABS: 10.0000 mg | ORAL_TABLET | Freq: Two times a day (BID) | ORAL | Status: DC

## 2014-09-03 MED ORDER — VENLAFAXINE HCL 37.5 MG PO TABS
37.5000 mg | ORAL_TABLET | Freq: Two times a day (BID) | ORAL | Status: DC
Start: 2014-09-03 — End: 2014-11-26

## 2014-09-03 MED ORDER — ALPRAZOLAM 0.5 MG PO TABS: 0.5000 mg | ORAL_TABLET | Freq: Every day | ORAL | Status: DC | PRN

## 2014-09-03 NOTE — Patient Instructions (Signed)
Trial of Effexor (generic ) for your new onset depression,  Can take 2nd dose by 3 pm to avoid insomnia  Referral to Dr Maryruth BunKapur is pending.   [facility]   rx for metoprolol to  Daily until then  To prevent additional  episodes of rapid heart rate

## 2014-09-03 NOTE — Assessment & Plan Note (Addendum)
He is using alprazolam rarely. The risks and benefits of benzodiazepine use were reviewed with patient today including excessive sedation leading to respiratory depression,  impaired thinking/driving, and addiction.  Patient was advised to avoid concurrent use with alcohol, to use medication only as needed and not to share with others  .

## 2014-09-03 NOTE — Assessment & Plan Note (Signed)
New onset.  Patient has been screened for suicidality and manic behavior/symptoms.  Risk and benefits of SSRI,  SNRI's, etc discussed .  Trial of effexor 37.5 mg bid.  Return one month.

## 2014-09-03 NOTE — Assessment & Plan Note (Signed)
Patient has been given refills for adderall for management of ADD for 1  month and will return in 1 month for followup on new onset depression.

## 2014-09-03 NOTE — Progress Notes (Signed)
Pre-visit discussion using our clinic review tool. No additional management support is needed unless otherwise documented below in the visit note.  

## 2014-09-03 NOTE — Assessment & Plan Note (Addendum)
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  Given FH of VT arrest in mother,.  Cardiology referral advised.  Metoprolol tartrated 25 mg bid use one to two daiy (resting HR and BP permitting).  12 Lead today is NSR with normal QT interval Cardiac enzymes,  Mg, TSH and lytes are all pending.  Considered the risks and benefits of suspending adderall, and chose to continue it  given his chronic use of it since high school and his worsening depression

## 2014-09-03 NOTE — Progress Notes (Signed)
Patient ID: Jonathan AnconaJohn Thomas Kerr, male   DOB: 09/26/1985, 29 y.o.   MRN: [id number]  . Patient Active Problem List   Diagnosis Date Noted  . Arrhythmia 09/03/2014  . Depression 09/03/2014  . Subacromial bursitis 11/26/2013  . Left shoulder pain 11/15/2013  . Anxiety as acute reaction to exceptional stress 05/26/2013  . Influenza vaccine needed 01/28/2013  . ADD (attention deficit hyperactivity disorder, inattentive type) 11/21/2012  . Insomnia secondary to anxiety 11/21/2012  . Acne, mild 11/21/2012    Subjective:  CC:   Chief Complaint  Patient presents with  . Follow-up    medication refill    HPI:   Jonathan Kerr is a 29 y.o. male who presents for  6 month follow up on ADD and insomnia secondary to anxiety . Marland Kitchen.  Patient was last seen 6 months ago, at which time symptoms were well controlled on a stable regimen of adderall.  He contineus to function well in his work.    He had an elective cholecystectomy on Feb 28th, after workup for intermittent abdominal pain and change in bowel habits  included a normal EGD and colonoscopy.  HIDA scan with CCK was abnormal.  Since his surgery,   All GI issues  Have resolved.   2 new issues:    1) Positive depression screen. Multiple symptoms,  Decreased sociability, increased sleep,  No desire for sexual intimacy.  Girlfriend has noticed change in normally positive outlook.  Has been present for several months.  Father succumbed to metastatic lung CA and died in Feburary 2015,  patietn moved here to be closer to him.  Mother is still alive and lives here as well. History fo MDD during adolescence ,  At age 29   Brief trial of zoloft,  Which was tolerated.   2) finally, mentioned ((per request from his girlfriend, who is plastic surgery resident at [employer] [employer]) , he reports that yesterday he had an episode of tachycardia that occurred after riding his bike to the gym. About 20 minutes into an exercise program (while doing yoga) HR was  160 . Stopped  workup bc felt dizzy and presyncopal,  Remained 120 bpm,, did not ride back home due to feeling so poolry .  Did not go to ER for evaluation.  Mother has history of VT arrest and is s/p AICD.  Patient's last cardiology evaluation was in fifth grade.     Past Medical History  Diagnosis Date  . Depression   . Chicken pox   . GERD (gastroesophageal reflux disease)   . Ulcer   . Hypertension     Past Surgical History  Procedure Laterality Date  . Cholecystectomy N/A Jun 22 2014       The following portions of the patient's history were reviewed and updated as appropriate: Allergies, current medications, and problem list.    Review of Systems:   Patient denies headache, fevers, malaise, unintentional weight loss, skin rash, eye pain, sinus congestion and sinus pain, sore throat, dysphagia,  hemoptysis , cough, dyspnea, wheezing, chest pain, palpitations, orthopnea, edema, abdominal pain, nausea, melena, diarrhea, constipation, flank pain, dysuria, hematuria, urinary  Frequency, nocturia, numbness, tingling, seizures,  Focal weakness, Loss of consciousness,  Tremor, insomnia, depression, anxiety, and suicidal ideation.     History   Social History  . Marital Status: Single    Spouse Name: N/A  . Number of Children: N/A  . Years of Education: N/A   Occupational History  . Not on file.   Social  History Main Topics  . Smoking status: Never Smoker   . Smokeless tobacco: Former Neurosurgeon    Types: Snuff  . Alcohol Use: 0.0 oz/week    0 Standard drinks or equivalent per week  . Drug Use: No  . Sexual Activity: Yes   Other Topics Concern  . Not on file   Social History Narrative    Objective:  Filed Vitals:   09/03/14 0913  BP: 118/70  Pulse: 72  Temp: 97.7 F (36.5 C)  Resp: 16     General appearance: alert, cooperative and appears stated age Ears: normal TM's and external ear canals both ears Throat: lips, mucosa, and tongue normal; teeth and gums normal Neck: no  adenopathy, no carotid bruit, supple, symmetrical, trachea midline and thyroid not enlarged, symmetric, no tenderness/mass/nodules Back: symmetric, no curvature. ROM normal. No CVA tenderness. Lungs: clear to auscultation bilaterally Heart: regular rate and rhythm, S1, S2 normal, no murmur, click, rub or gallop Abdomen: soft, non-tender; bowel sounds normal; no masses,  no organomegaly Pulses: 2+ and symmetric Skin: Skin color, texture, turgor normal. No rashes or lesions Lymph nodes: Cervical, supraclavicular, and axillary nodes normal.  Assessment and Plan:  Arrhythmia SVT vs VT , self limiting.  Given FH of VT arrest in mother,.  Cardiology referral advised.  Metoprolol tartrated 25 mg bid use one to two daiy (resting HR and BP permitting).  12 Lead today is NSR with normal QT interval Cardiac enzymes,  Mg, TSH and lytes are all pending.  Considered the risks and benefits of suspending adderall, and chose to continue it  given his chronic use of it since high school and his worsening depression    Depression New onset.  Patient has been screened for suicidality and manic behavior/symptoms.  Risk and benefits of SSRI,  SNRI's, etc discussed .  Trial of effexor 37.5 mg bid.  Return one month.    ADD (attention deficit hyperactivity disorder, inattentive type) Patient has been given refills for adderall for management of ADD for 1  month and will return in 1 month for followup on new onset depression.      Insomnia secondary to anxiety He is using alprazolam rarely. The risks and benefits of benzodiazepine use were reviewed with patient today including excessive sedation leading to respiratory depression,  impaired thinking/driving, and addiction.  Patient was advised to avoid concurrent use with alcohol, to use medication only as needed and not to share with others  .        A total of 40 minutes was spent with patient more than half of which was spent in counseling patient on the  above mentioned issues , reviewing EKG,  and coordination of care.  Updated Medication List Outpatient Encounter Prescriptions as of 09/03/2014  Medication Sig  . ALPRAZolam (XANAX) 0.5 MG tablet Take 1 tablet (0.5 mg total) by mouth daily as needed for anxiety.  Marland Kitchen amphetamine-dextroamphetamine (ADDERALL) 10 MG tablet Take 1 tablet (10 mg total) by mouth 2 (two) times daily with a meal.  . loratadine (CLARITIN) 10 MG tablet Take 10 mg by mouth daily.  Marland Kitchen omeprazole (PRILOSEC) 40 MG capsule Take 1 capsule (40 mg total) by mouth daily.  . [DISCONTINUED] ALPRAZolam (XANAX) 0.5 MG tablet Take 1 tablet (0.5 mg total) by mouth daily as needed for anxiety.  . [DISCONTINUED] amphetamine-dextroamphetamine (ADDERALL) 10 MG tablet Take 1 tablet (10 mg total) by mouth 2 (two) times daily with a meal.  . Diphenhyd-Hydrocort-Nystatin (FIRST-DUKES MOUTHWASH) SUSP Swish and spit  Up to 4 times daily (Patient not taking: Reported on 09/03/2014)  . metoprolol tartrate (LOPRESSOR) 25 MG tablet Take 1 tablet (25 mg total) by mouth 2 (two) times daily.  Marland Kitchen. venlafaxine (EFFEXOR) 37.5 MG tablet Take 1 tablet (37.5 mg total) by mouth 2 (two) times daily.  . [DISCONTINUED] Apremilast (OTEZLA) 30 MG TABS Take 1 tablet by mouth 2 (two) times daily.  . [DISCONTINUED] hyoscyamine (LEVSIN SL) 0.125 MG SL tablet Place 1 tablet (0.125 mg total) under the tongue every 4 (four) hours as needed. (Patient not taking: Reported on 09/03/2014)  . [DISCONTINUED] meloxicam (MOBIC) 15 MG tablet Take 1 tablet (15 mg total) by mouth daily. (Patient not taking: Reported on 09/03/2014)   No facility-administered encounter medications on file as of 09/03/2014.     Orders Placed This Encounter  Procedures  . Magnesium  . Comprehensive metabolic panel  . TSH  . Lipid panel  . Troponin I  . CK total and CKMB (cardiac)  . CBC with Differential/Platelet  . Ambulatory referral to Cardiology  . Ambulatory referral to Psychiatry  . EKG 12-Lead     Return in about 4 weeks (around 10/01/2014).

## 2014-09-04 LAB — CK TOTAL AND CKMB (NOT AT ARMC)
CK TOTAL: 170 U/L (ref 7–232)
CK, MB: <0.7 ng/mL (ref 0.0–5.0)

## 2014-09-08 ENCOUNTER — Encounter: Payer: Self-pay | Admitting: *Deleted

## 2014-10-06 ENCOUNTER — Ambulatory Visit: Payer: BLUE CROSS/BLUE SHIELD | Admitting: Internal Medicine

## 2014-10-22 ENCOUNTER — Ambulatory Visit: Payer: BLUE CROSS/BLUE SHIELD | Admitting: Internal Medicine

## 2014-10-23 ENCOUNTER — Ambulatory Visit: Payer: BLUE CROSS/BLUE SHIELD | Admitting: Internal Medicine

## 2014-11-03 ENCOUNTER — Other Ambulatory Visit: Payer: Self-pay | Admitting: Internal Medicine

## 2014-11-03 NOTE — Telephone Encounter (Signed)
Patient last appt 09/03/14 for new onset depression.  Gave one month of medication then told to schedule appointment for follow up, please advise refill?

## 2014-11-03 NOTE — Telephone Encounter (Signed)
Pt needs rx refill Adderall. Please advise pt/msn

## 2014-11-04 ENCOUNTER — Telehealth: Payer: Self-pay

## 2014-11-04 MED ORDER — AMPHETAMINE-DEXTROAMPHETAMINE 10 MG PO TABS: 10.0000 mg | ORAL_TABLET | Freq: Two times a day (BID) | ORAL | Status: DC

## 2014-11-04 NOTE — Telephone Encounter (Signed)
Notified patient that Adderall Rx is ready to be picked up.  He verbalized understanding and said he or his mother will pick it up.

## 2014-11-04 NOTE — Telephone Encounter (Signed)
Spoke with Patient, And scheduled an appt in early august.

## 2014-11-04 NOTE — Telephone Encounter (Signed)
Refill for 30 days only.  NEEDS follow up  ON DEPRESSION prior to any more refills

## 2014-11-26 ENCOUNTER — Ambulatory Visit (INDEPENDENT_AMBULATORY_CARE_PROVIDER_SITE_OTHER): Payer: BLUE CROSS/BLUE SHIELD | Admitting: Internal Medicine

## 2014-11-26 VITALS — BP 106/72 | HR 60 | Temp 98.2°F | Ht 73.0 in | Wt 167.8 lb

## 2014-11-26 DIAGNOSIS — F329 Major depressive disorder, single episode, unspecified: Secondary | ICD-10-CM | POA: Diagnosis not present

## 2014-11-26 DIAGNOSIS — F32A Depression, unspecified: Secondary | ICD-10-CM

## 2014-11-26 DIAGNOSIS — R002 Palpitations: Secondary | ICD-10-CM

## 2014-11-26 DIAGNOSIS — F900 Attention-deficit hyperactivity disorder, predominantly inattentive type: Secondary | ICD-10-CM

## 2014-11-26 MED ORDER — AMPHETAMINE-DEXTROAMPHETAMINE 10 MG PO TABS: 10.0000 mg | ORAL_TABLET | Freq: Two times a day (BID) | ORAL | Status: DC

## 2014-11-26 NOTE — Patient Instructions (Signed)
I'm glad you are feeling better!  Let me know if Dr Maryruth Bun decides to change your Adderall or start you on any meds  Keep the metoprolol for "prn use" not daily use

## 2014-11-26 NOTE — Progress Notes (Signed)
Pre visit review using our clinic review tool, if applicable. No additional management support is needed unless otherwise documented below in the visit note. 

## 2014-11-26 NOTE — Progress Notes (Signed)
Subjective:  Patient ID: Jonathan Kerr, male    DOB: Oct 20, 1985  Age: 29 y.o. MRN: [id number]  CC: The primary encounter diagnosis was Depression. Diagnoses of ADD (attention deficit hyperactivity disorder, inattentive type) and Palpitations were also pertinent to this visit.  HPI [name] presents for follow up on several issues  1) depression diagnosed at last visit in May.  He was prescibed venlafaxine but did not start Effexor due to concerns about the potential side effects.  His symptoms have improved since his last visit.  He has an appt with Maryruth Bun today first one  2) Tachycardia.  He did not start the metoprolol because he had an epsiode of insomnia after taking 25 mg metoprolol at 7 pm one evening.  He had an evaluation by [facility]ologist at [facility] and no signs of QT prolongation were seen.   3) ADD:  His concentration has been has at baseline on his usual dose of  Adderall.  Outpatient Prescriptions Prior to Visit  Medication Sig Dispense Refill  . ALPRAZolam (XANAX) 0.5 MG tablet Take 1 tablet (0.5 mg total) by mouth daily as needed for anxiety. 30 tablet 3  . omeprazole (PRILOSEC) 40 MG capsule Take 1 capsule (40 mg total) by mouth daily. 30 capsule 5  . amphetamine-dextroamphetamine (ADDERALL) 10 MG tablet Take 1 tablet (10 mg total) by mouth 2 (two) times daily with a meal. 60 tablet 0  . Diphenhyd-Hydrocort-Nystatin (FIRST-DUKES MOUTHWASH) SUSP Swish and spit   Up to 4 times daily (Patient not taking: Reported on 09/03/2014) 237 mL 1  . loratadine (CLARITIN) 10 MG tablet Take 10 mg by mouth daily.    . metoprolol tartrate (LOPRESSOR) 25 MG tablet Take 1 tablet (25 mg total) by mouth 2 (two) times daily. (Patient not taking: Reported on 11/26/2014) 60 tablet 3  . venlafaxine (EFFEXOR) 37.5 MG tablet Take 1 tablet (37.5 mg total) by mouth 2 (two) times daily. (Patient not taking: Reported on 11/26/2014) 60 tablet 1   No facility-administered medications prior to  visit.    Review of Systems;  Patient denies headache, fevers, malaise, unintentional weight loss, skin rash, eye pain, sinus congestion and sinus pain, sore throat, dysphagia,  hemoptysis , cough, dyspnea, wheezing, chest pain, palpitations, orthopnea, edema, abdominal pain, nausea, melena, diarrhea, constipation, flank pain, dysuria, hematuria, urinary  Frequency, nocturia, numbness, tingling, seizures,  Focal weakness, Loss of consciousness,  Tremor, insomnia, depression, anxiety, and suicidal ideation.      Objective:  BP 106/72 mmHg  Pulse 60  Temp(Src) 98.2 F (36.8 C) (Oral)  Ht 6\' 1"  (1.854 m)  Wt 167 lb 12.8 oz (76.114 kg)  BMI 22.14 kg/m2  SpO2 99%  BP Readings from Last 3 Encounters:  11/26/14 106/72  09/03/14 118/70  03/10/14 140/86    Wt Readings from Last 3 Encounters:  11/26/14 167 lb 12.8 oz (76.114 kg)  09/03/14 172 lb 8 oz (78.245 kg)  03/10/14 183 lb 12 oz (83.348 kg)    General appearance: alert, cooperative and appears stated age Ears: normal TM's and external ear canals both ears Throat: lips, mucosa, and tongue normal; teeth and gums normal Neck: no adenopathy, no carotid bruit, supple, symmetrical, trachea midline and thyroid not enlarged, symmetric, no tenderness/mass/nodules Back: symmetric, no curvature. ROM normal. No CVA tenderness. Lungs: clear to auscultation bilaterally Heart: regular rate and rhythm, S1, S2 normal, no murmur, click, rub or gallop Abdomen: soft, non-tender; bowel sounds normal; no masses,  no organomegaly Pulses: 2+ and symmetric  Skin: Skin color, texture, turgor normal. No rashes or lesions Lymph nodes: Cervical, supraclavicular, and axillary nodes normal.  No results found for: HGBA1C  Lab Results  Component Value Date   CREATININE 0.95 09/03/2014    Lab Results  Component Value Date   WBC 6.6 09/03/2014   HGB 14.6 09/03/2014   HCT 44.4 09/03/2014   PLT 167.0 09/03/2014   GLUCOSE 82 09/03/2014   CHOL 209*  09/03/2014   TRIG 115.0 09/03/2014   HDL 52.60 09/03/2014   LDLCALC 133* 09/03/2014   ALT 28 09/03/2014   AST 30 09/03/2014   NA 137 09/03/2014   K 4.1 09/03/2014   CL 101 09/03/2014   CREATININE 0.95 09/03/2014   BUN 12 09/03/2014   CO2 30 09/03/2014   TSH 0.87 09/03/2014    No results found.  Assessment & Plan:   Problem List Items Addressed This Visit      Unprioritized   ADD (attention deficit hyperactivity disorder, inattentive type)    Patient has been given refills for adderall for management of ADD         Palpitations    He had an uneventufl  cardiology evaluation , and did not tolerate metoprolol.      Depression - Primary    Resolved clinically.  No indication for effexor at this time         I have discontinued Mr. Otting venlafaxine, metoprolol tartrate, amphetamine-dextroamphetamine, and amphetamine-dextroamphetamine. I have also changed his amphetamine-dextroamphetamine. Additionally, I am having him maintain his loratadine, FIRST-DUKES MOUTHWASH, omeprazole, and ALPRAZolam.  Meds ordered this encounter  Medications  . DISCONTD: amphetamine-dextroamphetamine (ADDERALL) 10 MG tablet    Sig: Take 1 tablet (10 mg total) by mouth 2 (two) times daily with a meal. May refill on or after Dec 05 2014    Dispense:  60 tablet    Refill:  0  . DISCONTD: amphetamine-dextroamphetamine (ADDERALL) 10 MG tablet    Sig: Take 1 tablet (10 mg total) by mouth 2 (two) times daily with a meal. May refill on or after Sept 12 2016    Dispense:  60 tablet    Refill:  0  . amphetamine-dextroamphetamine (ADDERALL) 10 MG tablet    Sig: Take 1 tablet (10 mg total) by mouth 2 (two) times daily with a meal. May refill on or after Feb 04 2015    Dispense:  60 tablet    Refill:  0  A total of 25 minutes of face to face time was spent with patient more than half of which was spent in counselling about the above mentioned conditions  and coordination of care   Medications  Discontinued During This Encounter  Medication Reason  . metoprolol tartrate (LOPRESSOR) 25 MG tablet   . venlafaxine (EFFEXOR) 37.5 MG tablet   . amphetamine-dextroamphetamine (ADDERALL) 10 MG tablet Reorder  . amphetamine-dextroamphetamine (ADDERALL) 10 MG tablet Reorder  . amphetamine-dextroamphetamine (ADDERALL) 10 MG tablet Reorder    Follow-up: Return in about 3 months (around 02/26/2015).   Sherlene Shams, MD

## 2014-11-27 ENCOUNTER — Ambulatory Visit: Payer: BLUE CROSS/BLUE SHIELD | Admitting: Internal Medicine

## 2014-11-28 NOTE — Assessment & Plan Note (Signed)
Resolved clinically.  No indication for effexor at this time

## 2014-11-28 NOTE — Assessment & Plan Note (Signed)
Patient has been given refills for adderall for management of ADD

## 2014-11-28 NOTE — Assessment & Plan Note (Addendum)
He had an uneventufl  cardiology evaluation , and did not tolerate metoprolol.

## 2015-01-14 ENCOUNTER — Telehealth: Payer: Self-pay | Admitting: Internal Medicine

## 2015-01-14 NOTE — Telephone Encounter (Signed)
Got a note from Dr Maryruth Bun about his heart rate being SLOW (40 to 50) .  Is he feeling tired or having any dizzy spells?

## 2015-01-15 NOTE — Telephone Encounter (Signed)
Patient stated he feels fine no issues with dizziness or fatigue. FYI

## 2015-04-01 ENCOUNTER — Telehealth: Payer: Self-pay | Admitting: Internal Medicine

## 2015-04-01 ENCOUNTER — Ambulatory Visit: Payer: BLUE CROSS/BLUE SHIELD | Admitting: Internal Medicine

## 2015-04-01 NOTE — Telephone Encounter (Signed)
FYI, Pt called stating that he cannot get off of work for his appt today. Pt did make another appt for 04/29/2015. Appt is still on the sch. Thank You!

## 2015-04-29 ENCOUNTER — Ambulatory Visit (INDEPENDENT_AMBULATORY_CARE_PROVIDER_SITE_OTHER): Payer: BLUE CROSS/BLUE SHIELD | Admitting: Internal Medicine

## 2015-04-29 ENCOUNTER — Encounter: Payer: Self-pay | Admitting: Internal Medicine

## 2015-04-29 ENCOUNTER — Encounter (INDEPENDENT_AMBULATORY_CARE_PROVIDER_SITE_OTHER): Payer: Self-pay

## 2015-04-29 VITALS — BP 138/84 | HR 53 | Temp 98.1°F | Resp 12 | Ht 73.0 in | Wt 186.1 lb

## 2015-04-29 DIAGNOSIS — F419 Anxiety disorder, unspecified: Secondary | ICD-10-CM

## 2015-04-29 DIAGNOSIS — F900 Attention-deficit hyperactivity disorder, predominantly inattentive type: Secondary | ICD-10-CM

## 2015-04-29 DIAGNOSIS — K219 Gastro-esophageal reflux disease without esophagitis: Secondary | ICD-10-CM

## 2015-04-29 DIAGNOSIS — F5105 Insomnia due to other mental disorder: Secondary | ICD-10-CM

## 2015-04-29 MED ORDER — AMPHETAMINE-DEXTROAMPHETAMINE 10 MG PO TABS: 10.0000 mg | ORAL_TABLET | Freq: Two times a day (BID) | ORAL | Status: DC

## 2015-04-29 MED ORDER — OMEPRAZOLE 40 MG PO CPDR: 40.0000 mg | DELAYED_RELEASE_CAPSULE | Freq: Every day | ORAL | Status: DC

## 2015-04-29 NOTE — Patient Instructions (Signed)
Your BP is a little up today compared to previous measurements.  Please check your BP a few times over the next month and send me the readings.   Once you finish studying,  Try to turn off all "blue lit" devices 2 hours prior to bedtime to mitigate their effect on your sleep

## 2015-04-29 NOTE — Progress Notes (Signed)
Subjective:  Patient ID: Marca AnconaJohn Thomas Getman, male    DOB: 04/29/1985  Age: 30 y.o. MRN: [id number]  CC: The primary encounter diagnosis was Insomnia secondary to anxiety. Diagnoses of ADD (attention deficit hyperactivity disorder, inattentive type) and Gastroesophageal reflux disease without esophagitis were also pertinent to this visit.  HPI Marca AnconaJohn Thomas Rooke presents for follow up on ADD, anxiety, and  GERD. He was  last seen by Dr. Maryruth BunKapur in October and his dose of Zoloft was increased to 100 mg daily . He continues to use Xanax as needed for insomnia, and Dr Maryruth BunKapur has been prescribing it for him. .  He has gained 19 lbs since August, his last visit.  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  The work is not difficult,  But requires many hours of online work.    .  Outpatient Prescriptions Prior to Visit  Medication Sig Dispense Refill  . ALPRAZolam (XANAX) 0.5 MG tablet Take 1 tablet (0.5 mg total) by mouth daily as needed for anxiety. 30 tablet 3  . Diphenhyd-Hydrocort-Nystatin (FIRST-DUKES MOUTHWASH) SUSP Swish and spit   Up to 4 times daily 237 mL 1  . loratadine (CLARITIN) 10 MG tablet Take 10 mg by mouth daily.    Marland Kitchen. amphetamine-dextroamphetamine (ADDERALL) 10 MG tablet Take 1 tablet (10 mg total) by mouth 2 (two) times daily with a meal. May refill on or after Feb 04 2015 60 tablet 0  . omeprazole (PRILOSEC) 40 MG capsule Take 1 capsule (40 mg total) by mouth daily. 30 capsule 5   No facility-administered medications prior to visit.    Review of Systems;  Patient denies headache, fevers, malaise, unintentional weight loss, skin rash, eye pain, sinus congestion and sinus pain, sore throat, dysphagia,  hemoptysis , cough, dyspnea, wheezing, chest pain, palpitations, orthopnea, edema, abdominal pain, nausea, melena, diarrhea,  constipation, flank pain, dysuria, hematuria, urinary  Frequency, nocturia, numbness, tingling, seizures,  Focal weakness, Loss of consciousness,  Tremor, insomnia, depression, anxiety, and suicidal ideation.      Objective:  BP 138/84 mmHg  Pulse 53  Temp(Src) 98.1 F (36.7 C) (Oral)  Resp 12  Ht 6\' 1"  (1.854 m)  Wt 186 lb 2 oz (84.426 kg)  BMI 24.56 kg/m2  SpO2 99%  BP Readings from Last 3 Encounters:  04/29/15 138/84  11/26/14 106/72  09/03/14 118/70    Wt Readings from Last 3 Encounters:  04/29/15 186 lb 2 oz (84.426 kg)  11/26/14 167 lb 12.8 oz (76.114 kg)  09/03/14 172 lb 8 oz (78.245 kg)    General appearance: alert, cooperative and appears stated age Ears: normal TM's and external ear canals both ears Throat: lips, mucosa, and tongue normal; teeth and gums normal Neck: no adenopathy, no carotid bruit, supple, symmetrical, trachea midline and thyroid not enlarged, symmetric, no tenderness/mass/nodules Back: symmetric, no curvature. ROM normal. No CVA tenderness. Lungs: clear to auscultation bilaterally Heart: regular rate and rhythm, S1, S2 normal, no murmur, click, rub or gallop Abdomen: soft, non-tender; bowel sounds normal; no masses,  no organomegaly Pulses: 2+ and symmetric Skin: Skin color, texture, turgor normal. No rashes or lesions Lymph nodes: Cervical, supraclavicular, and axillary nodes normal.  No results found for: HGBA1C  Lab Results  Component Value Date   CREATININE 0.95 09/03/2014    Lab Results  Component Value  Date   WBC 6.6 09/03/2014   HGB 14.6 09/03/2014   HCT 44.4 09/03/2014   PLT 167.0 09/03/2014   GLUCOSE 82 09/03/2014   CHOL 209* 09/03/2014   TRIG 115.0 09/03/2014   HDL 52.60 09/03/2014   LDLCALC 133* 09/03/2014   ALT 28 09/03/2014   AST 30 09/03/2014   NA 137 09/03/2014   K 4.1 09/03/2014   CL 101 09/03/2014   CREATININE 0.95 09/03/2014   BUN 12 09/03/2014   CO2 30 09/03/2014   TSH 0.87 09/03/2014    No results  found.  Assessment & Plan:   Problem List Items Addressed This Visit    ADD (attention deficit hyperactivity disorder, inattentive type)    Patient has been taking the medication as prescribed and functioining well both at work and at home.  Denies side effects including tachycardia, nervousness and insomnia. Patient has been given refills for adderall for management of ADD           Insomnia secondary to anxiety - Primary    Aggravated by late night online work, . Reviewed principles of good sleep hygiene. Continue prn alprazolam for now.       Relevant Medications   sertraline (ZOLOFT) 100 MG tablet   GERD (gastroesophageal reflux disease)    Discussed current controversy regarding prolonged use of PPI in patients without documented Barretts esophagus.  Patient has no prior EGD but has been on PPI therapy for > 5 years (per patient).  Suggested trial of pepcid 20 mg daily.  If GERD symptoms return,  advised her to accept referral for EGD.       Relevant Medications   omeprazole (PRILOSEC) 40 MG capsule      I am having Mr. Golay maintain his loratadine, FIRST-DUKES MOUTHWASH, ALPRAZolam, sertraline, omeprazole, and amphetamine-dextroamphetamine.  Meds ordered this encounter  Medications  . sertraline (ZOLOFT) 100 MG tablet    Sig: Take 100 mg by mouth daily.    Refill:  3  . DISCONTD: amphetamine-dextroamphetamine (ADDERALL) 10 MG tablet    Sig: Take 1 tablet (10 mg total) by mouth 2 (two) times daily with a meal.    Dispense:  60 tablet    Refill:  0  . omeprazole (PRILOSEC) 40 MG capsule    Sig: Take 1 capsule (40 mg total) by mouth daily.    Dispense:  30 capsule    Refill:  5  . DISCONTD: amphetamine-dextroamphetamine (ADDERALL) 10 MG tablet    Sig: Take 1 tablet (10 mg total) by mouth 2 (two) times daily with a meal.    Dispense:  60 tablet    Refill:  0    May refill on or after May 27 2015  . amphetamine-dextroamphetamine (ADDERALL) 10 MG tablet    Sig: Take  1 tablet (10 mg total) by mouth 2 (two) times daily with a meal.    Dispense:  60 tablet    Refill:  0    May refill on or after June 24 2015    Medications Discontinued During This Encounter  Medication Reason  . amphetamine-dextroamphetamine (ADDERALL) 10 MG tablet Reorder  . omeprazole (PRILOSEC) 40 MG capsule Reorder  . amphetamine-dextroamphetamine (ADDERALL) 10 MG tablet Reorder  . amphetamine-dextroamphetamine (ADDERALL) 10 MG tablet Reorder    Follow-up: No Follow-up on file.   Sherlene Shams, MD

## 2015-04-29 NOTE — Progress Notes (Signed)
Pre-visit discussion using our clinic review tool. No additional management support is needed unless otherwise documented below in the visit note.  

## 2015-05-02 DIAGNOSIS — K219 Gastro-esophageal reflux disease without esophagitis: Secondary | ICD-10-CM | POA: Insufficient documentation

## 2015-05-02 NOTE — Assessment & Plan Note (Signed)
Patient has been taking the medication as prescribed and functioining well both at work and at home.  Denies side effects including tachycardia, nervousness and insomnia. Patient has been given refills for adderall for management of ADD

## 2015-05-02 NOTE — Assessment & Plan Note (Signed)
Discussed current controversy regarding prolonged use of PPI in patients without documented Barretts esophagus.  Patient has no prior EGD but has been on PPI therapy for > 5 years (per patient).  Suggested trial of pepcid 20 mg daily.  If GERD symptoms return,  advised her to accept referral for EGD.  

## 2015-05-02 NOTE — Assessment & Plan Note (Signed)
Aggravated by late night online work, . Reviewed principles of good sleep hygiene. Continue prn alprazolam for now.

## 2015-09-19 IMAGING — NM NUCLEAR MEDICINE HEPATOHBILIARY INCLUDE GB
2 series · 16 of 16 positions shown · non-contrast
Comparison: Abdominal ultrasound 05/06/2014

CLINICAL DATA: Postprandial abdominal pain with nausea and
vomiting. Symptoms for 2 years. Initial encounter.

EXAM:
NUCLEAR MEDICINE HEPATOBILIARY IMAGING WITH GALLBLADDER EF
TECHNIQUE: Sequential images of the abdomen were obtained [DATE] minutes
following intravenous administration of radiopharmaceutical. After
slow intravenous infusion of 1.61 micrograms Cholecystokinin,
gallbladder ejection fraction was determined.
RADIOPHARMACEUTICALS:  8.29 Millicurie 9c-PPm Choletec

[Series 1000: gallbladder ef · 4.80mm/px · 6 of 120 frames shown]
[frame 11/120]
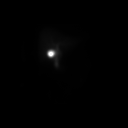
[frame 31/120]
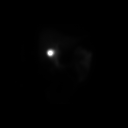
[frame 51/120]
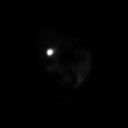
[frame 71/120]
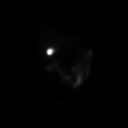
[frame 91/120]
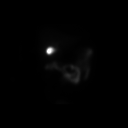
[frame 111/120]
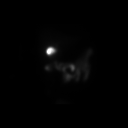

[Series 1000: gallbladder statics · 4.80mm/px · 10 of 10 slices shown]
[im 1/10]
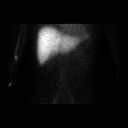
[im 2/10]
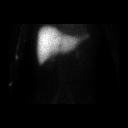
[im 3/10]
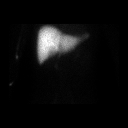
[im 4/10]
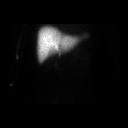
[im 5/10]
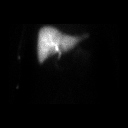
[im 6/10]
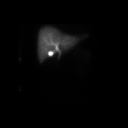
[im 7/10]
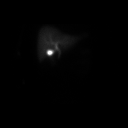
[im 8/10]
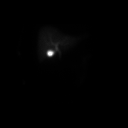
[im 9/10]
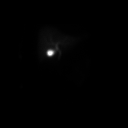
[im 10/10]
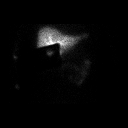

[16 of 16 positions shown; findings below may reference images not displayed]

FINDINGS: Initial images demonstrate homogeneous hepatic activity with
spontaneous filling of the biliary system and gallbladder.

Post CCK, there is adequate gallbladder contraction and increased
small bowel activity. The gallbladder ejection fraction calculated
at 30 minutes is 30%. Normal gallbladder ejection fraction with CCK
is 30% or greater.

The patient did experience symptoms (mild abdominal discomfort and
cramping) during the stimulated portion of the study.
IMPRESSION: 1. The cystic and common bile ducts are patent.
2. The gallbladder ejection fraction is at the lower limits of
normal (30%). Patient did experience mild abdominal discomfort and
cramping during CCK administration.

## 2015-11-04 ENCOUNTER — Ambulatory Visit: Payer: BLUE CROSS/BLUE SHIELD | Admitting: Internal Medicine

## 2015-12-03 ENCOUNTER — Ambulatory Visit: Payer: BLUE CROSS/BLUE SHIELD | Admitting: Internal Medicine

## 2016-01-09 ENCOUNTER — Other Ambulatory Visit: Payer: Self-pay | Admitting: Internal Medicine

## 2016-01-14 ENCOUNTER — Ambulatory Visit: Payer: BLUE CROSS/BLUE SHIELD | Admitting: Internal Medicine

## 2016-05-11 ENCOUNTER — Other Ambulatory Visit: Payer: Self-pay | Admitting: Internal Medicine

## 2016-05-16 ENCOUNTER — Ambulatory Visit: Payer: BLUE CROSS/BLUE SHIELD | Admitting: Internal Medicine

## 2016-05-24 ENCOUNTER — Encounter: Payer: Self-pay | Admitting: Internal Medicine

## 2016-05-24 ENCOUNTER — Ambulatory Visit (INDEPENDENT_AMBULATORY_CARE_PROVIDER_SITE_OTHER): Payer: PRIVATE HEALTH INSURANCE | Admitting: Internal Medicine

## 2016-05-24 VITALS — BP 150/88 | HR 77 | Resp 16 | Ht 73.0 in | Wt 193.0 lb

## 2016-05-24 DIAGNOSIS — R030 Elevated blood-pressure reading, without diagnosis of hypertension: Secondary | ICD-10-CM

## 2016-05-24 DIAGNOSIS — Z23 Encounter for immunization: Secondary | ICD-10-CM

## 2016-05-24 DIAGNOSIS — F900 Attention-deficit hyperactivity disorder, predominantly inattentive type: Secondary | ICD-10-CM | POA: Diagnosis not present

## 2016-05-24 DIAGNOSIS — Z0000 Encounter for general adult medical examination without abnormal findings: Secondary | ICD-10-CM | POA: Diagnosis not present

## 2016-05-24 DIAGNOSIS — R5383 Other fatigue: Secondary | ICD-10-CM

## 2016-05-24 DIAGNOSIS — F3341 Major depressive disorder, recurrent, in partial remission: Secondary | ICD-10-CM

## 2016-05-24 DIAGNOSIS — E559 Vitamin D deficiency, unspecified: Secondary | ICD-10-CM

## 2016-05-24 DIAGNOSIS — Z79899 Other long term (current) drug therapy: Secondary | ICD-10-CM | POA: Diagnosis not present

## 2016-05-24 DIAGNOSIS — R002 Palpitations: Secondary | ICD-10-CM | POA: Diagnosis not present

## 2016-05-24 DIAGNOSIS — E7801 Familial hypercholesterolemia: Secondary | ICD-10-CM

## 2016-05-24 DIAGNOSIS — R748 Abnormal levels of other serum enzymes: Secondary | ICD-10-CM | POA: Diagnosis not present

## 2016-05-24 LAB — CBC WITH DIFFERENTIAL/PLATELET
BASOS PCT: 0.6 % (ref 0.0–3.0)
Basophils Absolute: 0 10*3/uL (ref 0.0–0.1)
EOS PCT: 3.6 % (ref 0.0–5.0)
Eosinophils Absolute: 0.2 10*3/uL (ref 0.0–0.7)
HCT: 44.7 % (ref 39.0–52.0)
HEMOGLOBIN: 14.8 g/dL (ref 13.0–17.0)
Lymphocytes Relative: 25 % (ref 12.0–46.0)
Lymphs Abs: 1.5 10*3/uL (ref 0.7–4.0)
MCHC: 33 g/dL (ref 30.0–36.0)
MCV: 87.1 fl (ref 78.0–100.0)
MONOS PCT: 9.5 % (ref 3.0–12.0)
Monocytes Absolute: 0.6 10*3/uL (ref 0.1–1.0)
Neutro Abs: 3.7 10*3/uL (ref 1.4–7.7)
Neutrophils Relative %: 61.3 % (ref 43.0–77.0)
Platelets: 191 10*3/uL (ref 150.0–400.0)
RBC: 5.13 Mil/uL (ref 4.22–5.81)
RDW: 13.7 % (ref 11.5–15.5)
WBC: 6 10*3/uL (ref 4.0–10.5)

## 2016-05-24 LAB — COMPREHENSIVE METABOLIC PANEL
ALT: 41 U/L (ref 0–53)
AST: 40 U/L — AB (ref 0–37)
Albumin: 5 g/dL (ref 3.5–5.2)
Alkaline Phosphatase: 49 U/L (ref 39–117)
BUN: 18 mg/dL (ref 6–23)
CALCIUM: 10.4 mg/dL (ref 8.4–10.5)
CHLORIDE: 101 meq/L (ref 96–112)
CO2: 30 meq/L (ref 19–32)
Creatinine, Ser: 1.13 mg/dL (ref 0.40–1.50)
GFR: 80.15 mL/min (ref >60.00)
Glucose, Bld: 101 mg/dL — ABNORMAL HIGH (ref 70–99)
POTASSIUM: 4.6 meq/L (ref 3.5–5.1)
Sodium: 138 mEq/L (ref 135–145)
Total Bilirubin: 0.7 mg/dL (ref 0.2–1.2)
Total Protein: 8.1 g/dL (ref 6.0–8.3)

## 2016-05-24 LAB — LIPID PANEL
CHOL/HDL RATIO: 4
Cholesterol: 255 mg/dL — ABNORMAL HIGH (ref 0–200)
HDL: 60.8 mg/dL (ref >39.00)
LDL Cholesterol: 170 mg/dL — ABNORMAL HIGH (ref 0–99)
NONHDL: 194.43
Triglycerides: 122 mg/dL (ref 0.0–149.0)
VLDL: 24.4 mg/dL (ref 0.0–40.0)

## 2016-05-24 LAB — VITAMIN D 25 HYDROXY (VIT D DEFICIENCY, FRACTURES): VITD: 25.77 ng/mL — AB (ref 30.00–100.00)

## 2016-05-24 LAB — TSH: TSH: 1.41 u[IU]/mL (ref 0.35–4.50)

## 2016-05-24 MED ORDER — PROPRANOLOL HCL 10 MG PO TABS: 10.0000 mg | ORAL_TABLET | Freq: Three times a day (TID) | ORAL | 0 refills | Status: AC

## 2016-05-24 MED ORDER — SERTRALINE HCL 100 MG PO TABS: 100.0000 mg | ORAL_TABLET | Freq: Every day | ORAL | 0 refills | Status: DC

## 2016-05-24 MED ORDER — AMPHETAMINE-DEXTROAMPHETAMINE 10 MG PO TABS
10.0000 mg | ORAL_TABLET | Freq: Two times a day (BID) | ORAL | 0 refills | Status: DC
Start: 2016-05-24 — End: 2016-05-24

## 2016-05-24 MED ORDER — AMPHETAMINE-DEXTROAMPHETAMINE 10 MG PO TABS: 10.0000 mg | ORAL_TABLET | Freq: Two times a day (BID) | ORAL | 0 refills | Status: DC

## 2016-05-24 MED ORDER — ALPRAZOLAM 0.5 MG PO TABS: 0.5000 mg | ORAL_TABLET | Freq: Every day | ORAL | 3 refills | Status: DC | PRN

## 2016-05-24 MED ORDER — OMEPRAZOLE 40 MG PO CPDR: 40.0000 mg | DELAYED_RELEASE_CAPSULE | Freq: Every day | ORAL | 1 refills | Status: AC

## 2016-05-24 NOTE — Patient Instructions (Signed)
Good to see you!  Labs should be resulted in 48 hours  Let me know when you are ready to wean from zoloft  Get BP checked,  Resume metoprolol for BP consistently > 120/70  Health Maintenance, Male A healthy lifestyle and preventative care can promote health and wellness.  Maintain regular health, dental, and eye exams.  Eat a healthy diet. Foods like vegetables, fruits, whole grains, low-fat dairy products, and lean protein foods contain the nutrients you need and are low in calories. Decrease your intake of foods high in solid fats, added sugars, and salt. Get information about a proper diet from your health care provider, if necessary.  Regular physical exercise is one of the most important things you can do for your health. Most adults should get at least 150 minutes of moderate-intensity exercise (any activity that increases your heart rate and causes you to sweat) each week. In addition, most adults need muscle-strengthening exercises on 2 or more days a week.   Maintain a healthy weight. The body mass index (BMI) is a screening tool to identify possible weight problems. It provides an estimate of body fat based on height and weight. Your health care provider can find your BMI and can help you achieve or maintain a healthy weight. For males 20 years and older:  A BMI below 18.5 is considered underweight.  A BMI of 18.5 to 24.9 is normal.  A BMI of 25 to 29.9 is considered overweight.  A BMI of 30 and above is considered obese.  Maintain normal blood lipids and cholesterol by exercising and minimizing your intake of saturated fat. Eat a balanced diet with plenty of fruits and vegetables. Blood tests for lipids and cholesterol should begin at age 32 and be repeated every 5 years. If your lipid or cholesterol levels are high, you are over age 32, or you are at high risk for heart disease, you may need your cholesterol levels checked more frequently.Ongoing high lipid and cholesterol  levels should be treated with medicines if diet and exercise are not working.  If you smoke, find out from your health care provider how to quit. If you do not use tobacco, do not start.  Lung cancer screening is recommended for adults aged 55-80 years who are at high risk for developing lung cancer because of a history of smoking. A yearly low-dose CT scan of the lungs is recommended for people who have at least a 30-pack-year history of smoking and are current smokers or have quit within the past 15 years. A pack year of smoking is smoking an average of 1 pack of cigarettes a day for 1 year (for example, a 30-pack-year history of smoking could mean smoking 1 pack a day for 30 years or 2 packs a day for 15 years). Yearly screening should continue until the smoker has stopped smoking for at least 15 years. Yearly screening should be stopped for people who develop a health problem that would prevent them from having lung cancer treatment.  If you choose to drink alcohol, do not have more than 2 drinks per day. One drink is considered to be 12 oz (360 mL) of beer, 5 oz (150 mL) of wine, or 1.5 oz (45 mL) of liquor.  Avoid the use of street drugs. Do not share needles with anyone. Ask for help if you need support or instructions about stopping the use of drugs.  High blood pressure causes heart disease and increases the risk of stroke. High blood  pressure is more likely to develop in:  People who have blood pressure in the end of the normal range (100-139/85-89 mm Hg).  People who are overweight or obese.  People who are African American.  If you are 59-75 years of age, have your blood pressure checked every 3-5 years. If you are 43 years of age or older, have your blood pressure checked every year. You should have your blood pressure measured twice-once when you are at a hospital or clinic, and once when you are not at a hospital or clinic. Record the average of the two measurements. To check your  blood pressure when you are not at a hospital or clinic, you can use:  An automated blood pressure machine at a pharmacy.  A home blood pressure monitor.  If you are 72-63 years old, ask your health care provider if you should take aspirin to prevent heart disease.  Diabetes screening involves taking a blood sample to check your fasting blood sugar level. This should be done once every 3 years after age 7 if you are at a normal weight and without risk factors for diabetes. Testing should be considered at a younger age or be carried out more frequently if you are overweight and have at least 1 risk factor for diabetes.  Colorectal cancer can be detected and often prevented. Most routine colorectal cancer screening begins at the age of 63 and continues through age 67. However, your health care provider may recommend screening at an earlier age if you have risk factors for colon cancer. On a yearly basis, your health care provider may provide home test kits to check for hidden blood in the stool. A small camera at the end of a tube may be used to directly examine the colon (sigmoidoscopy or colonoscopy) to detect the earliest forms of colorectal cancer. Talk to your health care provider about this at age 60 when routine screening begins. A direct exam of the colon should be repeated every 5-10 years through age 65, unless early forms of precancerous polyps or small growths are found.  People who are at an increased risk for hepatitis B should be screened for this virus. You are considered at high risk for hepatitis B if:  You were born in a country where hepatitis B occurs often. Talk with your health care provider about which countries are considered high risk.  Your parents were born in a high-risk country and you have not received a shot to protect against hepatitis B (hepatitis B vaccine).  You have HIV or AIDS.  You use needles to inject street drugs.  You live with, or have sex with,  someone who has hepatitis B.  You are a man who has sex with other men (MSM).  You get hemodialysis treatment.  You take certain medicines for conditions like cancer, organ transplantation, and autoimmune conditions.  Hepatitis C blood testing is recommended for all people born from [birth year] through 1966 and any individual with known risk factors for hepatitis C.  Healthy men should no longer receive prostate-specific antigen (PSA) blood tests as part of routine cancer screening. Talk to your health care provider about prostate cancer screening.  Testicular cancer screening is not recommended for adolescents or adult males who have no symptoms. Screening includes self-exam, a health care provider exam, and other screening tests. Consult with your health care provider about any symptoms you have or any concerns you have about testicular cancer.  Practice safe sex. Use condoms and avoid  high-risk sexual practices to reduce the spread of sexually transmitted infections (STIs).  You should be screened for STIs, including gonorrhea and chlamydia if:  You are sexually active and are younger than 24 years.  You are older than 24 years, and your health care provider tells you that you are at risk for this type of infection.  Your sexual activity has changed since you were last screened, and you are at an increased risk for chlamydia or gonorrhea. Ask your health care provider if you are at risk.  If you are at risk of being infected with HIV, it is recommended that you take a prescription medicine daily to prevent HIV infection. This is called pre-exposure prophylaxis (PrEP). You are considered at risk if:  You are a man who has sex with other men (MSM).  You are a heterosexual man who is sexually active with multiple partners.  You take drugs by injection.  You are sexually active with a partner who has HIV.  Talk with your health care provider about whether you are at high risk of being  infected with HIV. If you choose to begin PrEP, you should first be tested for HIV. You should then be tested every 3 months for as long as you are taking PrEP.  Use sunscreen. Apply sunscreen liberally and repeatedly throughout the day. You should seek shade when your shadow is shorter than you. Protect yourself by wearing long sleeves, pants, a wide-brimmed hat, and sunglasses year round whenever you are outdoors.  Tell your health care provider of new moles or changes in moles, especially if there is a change in shape or color. Also, tell your health care provider if a mole is larger than the size of a pencil eraser.  A one-time screening for abdominal aortic aneurysm (AAA) and surgical repair of large AAAs by ultrasound is recommended for men aged 65-75 years who are current or former smokers.  Stay current with your vaccines (immunizations). This information is not intended to replace advice given to you by your health care provider. Make sure you discuss any questions you have with your health care provider. Document Released: 10/08/2007 Document Revised: 05/02/2014 Document Reviewed: 01/13/2015 Elsevier Interactive Patient Education  2017 [employer].

## 2016-05-24 NOTE — Progress Notes (Signed)
Patient ID: Jonathan Kerr, male    DOB: 08/07/1985  Age: 31 y.o. MRN: [id number]  The patient is here for annualexamination and management of other chronic and acute problems.   The risk factors are reflected in the social history.  The roster of all physicians providing medical care to patient - is listed in the Snapshot section of the chart.  Home safety : The patient has smoke detectors in the home. They wear seatbelts.  There are no firearms at home. There is no violence in the home.   There is no risks for hepatitis, STDs or HIV. There is no   history of blood transfusion. They have no travel history to infectious disease endemic areas of the world.  The patient has seen their dentist in the last six month. They have seen their eye doctor in the last year.   They do not  have excessive sun exposure. Discussed the need for sun protection: hats, long sleeves and use of sunscreen if there is significant sun exposure.   Diet: the importance of a healthy diet is discussed. They do have a healthy diet.  The benefits of regular aerobic exercise were discussed. She walks 4 times per week ,  20 minutes.   Depression screen: there are no signs or vegative symptoms of untreated depression- irritability, change in appetite, anhedonia, sadness/tearfullness.  The following portions of the patient's history were reviewed and updated as appropriate: allergies, current medications, past family history, past medical history,  past surgical history, past social history  and problem list.  Visual acuity was not assessed per patient preference since she has regular follow up with her ophthalmologist. Hearing and body mass index were assessed and reviewed.   During the course of the visit the patient was educated and counseled about appropriate screening and preventive services including : fall prevention , diabetes screening, nutrition counseling, colorectal cancer screening, and recommended immunizations.     CC: The primary encounter diagnosis was Fatigue, unspecified type. Diagnoses of Long-term use of high-risk medication, Vitamin D deficiency, Familial hypercholesterolemia, Need for Tdap vaccination, Recurrent major depressive disorder, in partial remission (HCC), Attention deficit hyperactivity disorder (ADHD), predominantly inattentive type, Elevated blood-pressure reading without diagnosis of hypertension, Elevated liver enzymes, Palpitations, and Encounter for preventive health examination were also pertinent to this visit.  follow up on  ADD , anxiety .   He was last seen here one year ago. Underwent a lap chole in Feb 2016.  Has no trouble with loose stools  as long as he pretreats before greasy meals with immodium .  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 Exercising  Regularly with new wife who is in residency at [employer] .  The two plan to move to [place] in 2020   Previously managed by Dr Maryruth Bun, but his new insurance does not cover her visits.  He is requesting management of his ADD/GAD by me since he has been stable.    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     GAD Feels he may be able to wean off zoloft this summer.   Had a protein shake this morning    History Jonathan Kerr has a past medical history of Chicken pox;  Depression; GERD (gastroesophageal reflux disease); Hypertension; and Ulcer (HCC).   He has a past surgical history that includes Cholecystectomy (N/A, Jun 22 2014).   His family history includes Arthritis in his mother; Cancer in his father and paternal uncle; Heart disease in his mother.He reports that he has never smoked. He has quit using smokeless tobacco. His smokeless tobacco use included Snuff. He reports that he drinks alcohol. He  reports that he does not use drugs.  Outpatient Medications Prior to Visit  Medication Sig Dispense Refill  . loratadine (CLARITIN) 10 MG tablet Take 10 mg by mouth daily.    Marland Kitchen. ALPRAZolam (XANAX) 0.5 MG tablet Take 1 tablet (0.5 mg total) by mouth daily as needed for anxiety. 30 tablet 3  . amphetamine-dextroamphetamine (ADDERALL) 10 MG tablet Take 1 tablet (10 mg total) by mouth 2 (two) times daily with a meal. 60 tablet 0  . omeprazole (PRILOSEC) 40 MG capsule TAKE 1 CAPSULE (40 MG TOTAL) BY MOUTH DAILY. 30 capsule 1  . sertraline (ZOLOFT) 100 MG tablet Take 100 mg by mouth daily.  3  . Diphenhyd-Hydrocort-Nystatin (FIRST-DUKES MOUTHWASH) SUSP Swish and spit   Up to 4 times daily (Patient not taking: Reported on 05/24/2016) 237 mL 1   No facility-administered medications prior to visit.     Review of Systems   Patient denies headache, fevers, malaise, unintentional weight loss, skin rash, eye pain, sinus congestion and sinus pain, sore throat, dysphagia,  hemoptysis , cough, dyspnea, wheezing, chest pain, palpitations, orthopnea, edema, abdominal pain, nausea, melena, diarrhea, constipation, flank pain, dysuria, hematuria, urinary  Frequency, nocturia, numbness, tingling, seizures,  Focal weakness, Loss of consciousness,  Tremor, insomnia, depression, anxiety, and suicidal ideation.      Objective:  BP (!) 150/88   Pulse 77   Resp 16   Ht 6\' 1"  (1.854 m)   Wt 193 lb (87.5 kg)   SpO2 97%   BMI 25.46 kg/m   Physical Exam   General appearance: alert, cooperative and appears stated age Ears: normal TM's and external ear canals both ears Throat: lips, mucosa, and tongue normal; teeth and gums normal Neck: no adenopathy, no carotid bruit, supple, symmetrical, trachea midline and thyroid not enlarged, symmetric, no tenderness/mass/nodules Back: symmetric, no curvature. ROM normal. No CVA tenderness. Lungs: clear to auscultation bilaterally Heart: regular rate and rhythm, S1, S2  normal, no murmur, click, rub or gallop Abdomen: soft, non-tender; bowel sounds normal; no masses,  no organomegaly Pulses: 2+ and symmetric Skin: Skin color, texture, turgor normal. No rashes or lesions Lymph nodes: Cervical, supraclavicular, and axillary nodes normal.    Assessment & Plan:   Problem List Items Addressed This Visit    Attention deficit hyperactivity disorder (ADHD), predominantly inattentive type    Managed with stable doses of adderall.  Refills given .  Refill history confirmed via [place] Controlled Substance databas, accessed by me today..      Elevated blood-pressure reading without diagnosis of hypertension    He has no prior history of hypertension. He will check his blood pressure several times over the next 3-4 weeks and to submit readings for evaluation. Urine microalbumin to creatinine ratio will be done when he returns for repeat liver enzymes.   Lab Results  Component Value Date   CREATININE 1.13 05/24/2016   Lab Results  Component Value Date   NA 138 05/24/2016   K 4.6 05/24/2016   CL 101 05/24/2016   CO2 30 05/24/2016  No results found for: Concepcion ElkMICROALBUR, [id number]  Relevant Orders   Microalbumin / creatinine urine ratio   Elevated liver enzymes    AST is slightly elevated.  Will repeat in one month..  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  During the course of the visit the patient was educated and counseled about appropriate screening and preventive services including :  diabetes screening, lipid analysis with projected  10 year  risk for CAD , nutrition counseling, ,STD screening  and recommended immunizations.  Printed recommendations for health maintenance  screenings was given.       Palpitations    Occurring infrequently.   Inderal prn .  If bp medication is needed for control of new onset hypertension, will use metoprolol       Recurrent major depression (HCC)    Managed with zoloft , previously managed by Tamsen Roers MD. Refills given.  Instructions on weaning of zoloft given as patient feels he will be able to dc medication this summer.       Relevant Medications   sertraline (ZOLOFT) 100 MG tablet   ALPRAZolam (XANAX) 0.5 MG tablet    Other Visit Diagnoses    Fatigue, unspecified type    -  Primary   Relevant Orders   Comprehensive metabolic panel (Completed)   HIV antibody (Completed)   CBC with Differential/Platelet (Completed)   TSH (Completed)   Hepatitis C antibody (Completed)   Long-term use of high-risk medication       Vitamin D deficiency       Relevant Orders   VITAMIN D 25 Hydroxy (Vit-D Deficiency, Fractures) (Completed)   Familial hypercholesterolemia       Relevant Medications   propranolol (INDERAL) 10 MG tablet   Other Relevant Orders   Lipid panel (Completed)   Need for Tdap vaccination       Relevant Orders   Tdap vaccine greater than or equal to 7yo IM (Completed)      I have discontinued Mr. Russomanno amphetamine-dextroamphetamine and amphetamine-dextroamphetamine. I have also changed his sertraline and amphetamine-dextroamphetamine. Additionally, I am having him start on propranolol. Lastly, I am having him maintain his loratadine, FIRST-DUKES MOUTHWASH, omeprazole, and ALPRAZolam.  Meds ordered this encounter  Medications  . DISCONTD: amphetamine-dextroamphetamine (ADDERALL) 10 MG tablet    Sig: Take 1 tablet (10 mg total) by mouth 2 (two) times daily with a meal.    Dispense:  60 tablet    Refill:  0  . omeprazole (PRILOSEC) 40 MG capsule    Sig: Take 1 capsule (40 mg total) by mouth daily.    Dispense:  90 capsule    Refill:  1  . sertraline (ZOLOFT) 100 MG tablet    Sig: Take 1 tablet (100  mg total) by mouth daily.    Dispense:  90 tablet    Refill:  0    Keep on fil for future refills  . DISCONTD: amphetamine-dextroamphetamine (ADDERALL) 10 MG tablet    Sig: Take 1 tablet (10 mg total) by mouth 2 (two) times daily with a meal.    Dispense:  60 tablet    Refill:  0    May refill on or after Jun 22 2016  . amphetamine-dextroamphetamine (ADDERALL) 10 MG  tablet    Sig: Take 1 tablet (10 mg total) by mouth 2 (two) times daily with a meal. May refill on or after July 22 2016    Dispense:  60 tablet    Refill:  0  . propranolol (INDERAL) 10 MG tablet    Sig: Take 1 tablet (10 mg total) by mouth 3 (three) times daily. As needed for rapid heart rate    Dispense:  30 tablet    Refill:  0  . ALPRAZolam (XANAX) 0.5 MG tablet    Sig: Take 1 tablet (0.5 mg total) by mouth daily as needed for anxiety.    Dispense:  30 tablet    Refill:  3    Medications Discontinued During This Encounter  Medication Reason  . amphetamine-dextroamphetamine (ADDERALL) 10 MG tablet Reorder  . omeprazole (PRILOSEC) 40 MG capsule Reorder  . sertraline (ZOLOFT) 100 MG tablet Reorder  . amphetamine-dextroamphetamine (ADDERALL) 10 MG tablet Reorder  . amphetamine-dextroamphetamine (ADDERALL) 10 MG tablet Reorder  . ALPRAZolam (XANAX) 0.5 MG tablet Reorder    Follow-up: Return in about 6 months (around 11/21/2016).   Sherlene Shams, MD

## 2016-05-25 LAB — HEPATITIS C ANTIBODY: HCV Ab: NEGATIVE

## 2016-05-25 LAB — HIV ANTIBODY (ROUTINE TESTING W REFLEX): HIV 1&2 Ab, 4th Generation: NONREACTIVE

## 2016-05-26 ENCOUNTER — Other Ambulatory Visit: Payer: Self-pay | Admitting: Internal Medicine

## 2016-05-26 DIAGNOSIS — R748 Abnormal levels of other serum enzymes: Secondary | ICD-10-CM | POA: Insufficient documentation

## 2016-05-26 DIAGNOSIS — R945 Abnormal results of liver function studies: Principal | ICD-10-CM

## 2016-05-26 DIAGNOSIS — R030 Elevated blood-pressure reading, without diagnosis of hypertension: Secondary | ICD-10-CM | POA: Insufficient documentation

## 2016-05-26 DIAGNOSIS — R7989 Other specified abnormal findings of blood chemistry: Secondary | ICD-10-CM

## 2016-05-26 DIAGNOSIS — Z0000 Encounter for general adult medical examination without abnormal findings: Secondary | ICD-10-CM | POA: Insufficient documentation

## 2016-05-26 NOTE — Assessment & Plan Note (Signed)
Managed with stable doses of adderall.  Refills given .  Refill history confirmed via [place] Controlled Substance databas, accessed by me today..Marland Kitchen

## 2016-05-26 NOTE — Assessment & Plan Note (Signed)
AST is slightly elevated.  Will repeat in one month..  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

## 2016-05-26 NOTE — Assessment & Plan Note (Signed)
Occurring infrequently.   Inderal prn .  If bp medication is needed for control of new onset hypertension, will use metoprolol

## 2016-05-26 NOTE — Assessment & Plan Note (Addendum)
He has no prior history of hypertension. He will check his blood pressure several times over the next 3-4 weeks and to submit readings for evaluation. Urine microalbumin to creatinine ratio will be done when he returns for repeat liver enzymes.   Lab Results  Component Value Date   CREATININE 1.13 05/24/2016   Lab Results  Component Value Date   NA 138 05/24/2016   K 4.6 05/24/2016   CL 101 05/24/2016   CO2 30 05/24/2016  No results found for: Concepcion ElkMICROALBUR, [id number]

## 2016-05-26 NOTE — Assessment & Plan Note (Signed)
Managed with zoloft , previously managed by Tamsen RoersArtie Kapur MD. Refills given.  Instructions on weaning of zoloft given as patient feels he will be able to dc medication this summer.

## 2016-05-26 NOTE — Assessment & Plan Note (Addendum)
Annual comprehensive preventive exam was done as well as an evaluation and management of acute and chronic conditions .  During the course of the visit the patient was educated and counseled about appropriate screening and preventive services including :  diabetes screening, lipid analysis with projected  10 year  risk for CAD , nutrition counseling, ,STD screening  and recommended immunizations.  Printed recommendations for health maintenance screenings was given.

## 2016-09-26 ENCOUNTER — Other Ambulatory Visit: Payer: Self-pay | Admitting: Internal Medicine

## 2016-09-26 MED ORDER — AMPHETAMINE-DEXTROAMPHETAMINE 10 MG PO TABS: 10.0000 mg | ORAL_TABLET | Freq: Two times a day (BID) | ORAL | 0 refills | Status: DC

## 2016-09-26 NOTE — Telephone Encounter (Signed)
Pt called about needing a refill for amphetamine-dextroamphetamine (ADDERALL) 10 MG tablet. Pt does have a appt scheduled. Please advise?   Thank you!

## 2016-09-26 NOTE — Telephone Encounter (Signed)
Refilled: 05/24/2016 Last OV: 05/24/2016 Next Ov: 10/10/2016

## 2016-09-26 NOTE — Telephone Encounter (Signed)
Refill for 30 days only.  OFFICE VISIT NEEDED prior to any more refills 

## 2016-09-27 NOTE — Telephone Encounter (Signed)
Pt was notified that his rx has been placed up front for pick up. The pt stated that he would be by this week to pick it up.

## 2016-10-10 ENCOUNTER — Ambulatory Visit: Payer: PRIVATE HEALTH INSURANCE | Admitting: Internal Medicine

## 2016-10-18 ENCOUNTER — Telehealth: Payer: Self-pay | Admitting: Internal Medicine

## 2016-10-18 NOTE — Telephone Encounter (Signed)
Pt's mom Belvedere [employer] will be coming in to pick up his rx which is in the folder up front.

## 2016-11-23 ENCOUNTER — Other Ambulatory Visit: Payer: Self-pay | Admitting: Internal Medicine

## 2016-12-01 ENCOUNTER — Other Ambulatory Visit: Payer: Self-pay | Admitting: Internal Medicine

## 2016-12-01 NOTE — Telephone Encounter (Signed)
Pt called and cancelled his appt on 8/17 do to work, has rescheduled for 9/19. Pt will need a refill on his amphetamine-dextroamphetamine (ADDERALL) 10 MG tablet. Please advise, thank you!  Call pt @ [phone]

## 2016-12-01 NOTE — Telephone Encounter (Signed)
Refilled: 09/26/2016 Last OV: 05/24/2016 Next OV: 01/11/2017

## 2016-12-04 MED ORDER — AMPHETAMINE-DEXTROAMPHETAMINE 10 MG PO TABS: 10.0000 mg | ORAL_TABLET | Freq: Two times a day (BID) | ORAL | 0 refills | Status: AC

## 2016-12-04 NOTE — Telephone Encounter (Signed)
Refill for 30 days only. There will be no more refills with out OV

## 2016-12-05 NOTE — Telephone Encounter (Signed)
Pt notified. Rx has been printed, signed and faxed.

## 2016-12-09 ENCOUNTER — Ambulatory Visit: Payer: PRIVATE HEALTH INSURANCE | Admitting: Internal Medicine

## 2017-01-11 ENCOUNTER — Ambulatory Visit: Payer: PRIVATE HEALTH INSURANCE | Admitting: Internal Medicine

## 2017-01-11 DIAGNOSIS — Z0289 Encounter for other administrative examinations: Secondary | ICD-10-CM

## 2017-01-20 ENCOUNTER — Other Ambulatory Visit: Payer: Self-pay | Admitting: Internal Medicine

## 2017-01-20 MED ORDER — SERTRALINE HCL 100 MG PO TABS: 100.0000 mg | ORAL_TABLET | Freq: Every day | ORAL | 0 refills | Status: AC

## 2017-01-20 NOTE — Telephone Encounter (Signed)
Refilled: 05/24/2016 Last OV: 05/24/2016 Cancel appt: 12/09/2016 No Showed appt: 01/11/2017 Next OV: not scheduled

## 2017-01-20 NOTE — Telephone Encounter (Signed)
Pt called about needing a refill for sertraline (ZOLOFT) 100 MG tablet   Pharmacy is [facility]. [phone].

## 2017-01-20 NOTE — Telephone Encounter (Signed)
rx has been printed, signed and faxed.  

## 2017-01-20 NOTE — Telephone Encounter (Signed)
PATIENT NEEDS TO BE CALLED.  NEEDS 6 MONTH FOLLOW UP. Refill for 30 days only.  OFFICE VISIT NEEDED prior to any more refills

## 2017-04-03 ENCOUNTER — Other Ambulatory Visit: Payer: Self-pay | Admitting: Internal Medicine

## 2017-04-05 NOTE — Telephone Encounter (Signed)
Refilled: 11/24/2016 Last OV: 05/24/2016 Next OV: not scheduled

## 2017-05-08 ENCOUNTER — Other Ambulatory Visit: Payer: Self-pay | Admitting: Internal Medicine

## 2017-05-10 NOTE — Telephone Encounter (Signed)
Refilled: 01/20/2017 Last OV: 05/24/2016 Next OV: not scheduled
